# Patient Record
Sex: Male | Born: 1965 | Race: White | Hispanic: No | Marital: Married | Smoking: Never smoker
Health system: Southern US, Community
[De-identification: ages and names within clinical notes are randomized; demographics above are authoritative.]

## PROBLEM LIST (undated history)

## (undated) DIAGNOSIS — I10 Essential (primary) hypertension: Secondary | ICD-10-CM

## (undated) DIAGNOSIS — G4730 Sleep apnea, unspecified: Secondary | ICD-10-CM

## (undated) DIAGNOSIS — Z96619 Presence of unspecified artificial shoulder joint: Secondary | ICD-10-CM

## (undated) DIAGNOSIS — Z955 Presence of coronary angioplasty implant and graft: Secondary | ICD-10-CM

## (undated) DIAGNOSIS — D751 Secondary polycythemia: Secondary | ICD-10-CM

## (undated) DIAGNOSIS — E785 Hyperlipidemia, unspecified: Secondary | ICD-10-CM

## (undated) DIAGNOSIS — I509 Heart failure, unspecified: Secondary | ICD-10-CM

## (undated) DIAGNOSIS — I2510 Atherosclerotic heart disease of native coronary artery without angina pectoris: Secondary | ICD-10-CM

## (undated) HISTORY — PX: APPENDECTOMY: SHX54

## (undated) HISTORY — PX: TOTAL SHOULDER REPLACEMENT: SUR1217

## (undated) HISTORY — PX: OTHER SURGICAL HISTORY: SHX169

## (undated) HISTORY — PX: HERNIA REPAIR: SHX51

---

## 2000-07-26 ENCOUNTER — Ambulatory Visit (HOSPITAL_COMMUNITY): Admission: RE | Admit: 2000-07-26 | Discharge: 2000-07-26 | Payer: Self-pay | Admitting: Surgery

## 2005-08-10 ENCOUNTER — Encounter: Payer: Self-pay | Admitting: Urology

## 2005-08-29 ENCOUNTER — Encounter: Payer: Self-pay | Admitting: Urology

## 2006-01-18 ENCOUNTER — Encounter: Admission: RE | Admit: 2006-01-18 | Discharge: 2006-01-18 | Payer: Self-pay | Admitting: Specialist

## 2006-07-24 ENCOUNTER — Encounter: Admission: RE | Admit: 2006-07-24 | Discharge: 2006-07-24 | Payer: Self-pay | Admitting: Specialist

## 2006-07-27 ENCOUNTER — Encounter: Admission: RE | Admit: 2006-07-27 | Discharge: 2006-07-27 | Payer: Self-pay | Admitting: Orthopedic Surgery

## 2006-08-05 ENCOUNTER — Ambulatory Visit (HOSPITAL_BASED_OUTPATIENT_CLINIC_OR_DEPARTMENT_OTHER): Admission: RE | Admit: 2006-08-05 | Discharge: 2006-08-05 | Payer: Self-pay | Admitting: Orthopedic Surgery

## 2008-02-27 ENCOUNTER — Encounter: Admission: RE | Admit: 2008-02-27 | Discharge: 2008-02-27 | Payer: Self-pay | Admitting: Specialist

## 2008-03-06 ENCOUNTER — Encounter: Admission: RE | Admit: 2008-03-06 | Discharge: 2008-03-06 | Payer: Self-pay | Admitting: Specialist

## 2008-10-08 ENCOUNTER — Encounter: Admission: RE | Admit: 2008-10-08 | Discharge: 2008-10-08 | Payer: Self-pay | Admitting: Specialist

## 2009-04-15 ENCOUNTER — Encounter: Admission: RE | Admit: 2009-04-15 | Discharge: 2009-04-15 | Payer: Self-pay | Admitting: Specialist

## 2009-10-21 ENCOUNTER — Encounter: Admission: RE | Admit: 2009-10-21 | Discharge: 2009-10-21 | Payer: Self-pay | Admitting: Specialist
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
## 2010-06-16 NOTE — Op Note (Signed)
[facility]  Patient:    Paul Bennett, Paul Bennett                    MRN: [id number] Proc. Date: 07/26/00 Adm. Date:  [id number] Attending:  Bonnetta Barry                           Operative Report  PREOPERATIVE DIAGNOSIS:  Right inguinal hernia, reducible.  POSTOPERATIVE DIAGNOSIS:  Right inguinal hernia, reducible.  PROCEDURE:  Repair of right inguinal hernia with Prolene mesh.  SURGEON:  Velora Heckler, M.D.  ANESTHESIA:  General per Dr. Rica Mast.  ESTIMATED BLOOD LOSS:  Minimal.  PREPARATION:  Betadine.  COMPLICATIONS:  None.  INDICATIONS:  The patient is a 45 year old white male who presents with a right inguinal hernia present since February 2002.  The patient has noted some discomfort associated with bowel movements.  It has caused him some restriction in his weight lifting activities.  He now comes to surgery for repair.  DESCRIPTION OF PROCEDURE:  The procedure was done in OR #1 at the [facility].  The patient was brought to the operating room and placed in a supine position on the operating room table.  Following the administration of general anesthesia, the patient is prepped and draped in the usual strict aseptic fashion.  After ascertaining an adequate level of anesthesia had been obtained, a right inguinal incision is made with a #10 blade.  Dissection is carried down through the subcutaneous tissues. Hemostasis was obtained with the electrocautery.  External oblique fascia is incised in line with its fibers and extended through the external inguinal ring, taking care to preserve the ilioinguinal nerve.  Nerve and cord structures are encircled and a Penrose drain.  Good hemostasis is noted.  The floor of the inguinal canal is defined.  The floor is somewhat attenuated but largely intact.  The cord is then explored, and there is an indirect inguinal hernia sac present.  This is dissected away from the cord  structures up to the level of the internal inguinal ring.  A high ligation of the cord is performed with a 2-0 silk suture ligature.  The sac is excised and discarded.  Next, the floor of the inguinal canal is reinforced with a sheet of Prolene mesh.  The mesh is cut to the appropriate dimensions.  It is secured to the pubic tubercle and along the inguinal ligament with a running 2-0 Novofil suture. Mesh is split to accommodate the cord structures.  Superior margin of the mesh is secured to the transversalis and external oblique fascia with interrupted 2-0 Novofil sutures.  Tails of the mesh are overlapped, and a single interrupted 2-0 Novofil suture is placed lateral to the cord structures to secure the inferior edges of the tails of the mesh to the ilioinguinal ligament.  Good hemostasis is noted.  The cord structures are returned to the inguinal canal.  Local field block is placed with Marcaine.  The external oblique fascia is closed with interrupted 3-0 Vicryl sutures.  Scarpas fascia is closed with interrupted 3-0 Vicryl sutures.  The skin edges are anesthetized with local anesthetic.  The skin edges are closed with interrupted 4-0 Vicryl subcuticular sutures.  The wound is washed and dried, and Benzoin and Steri-Strips are applied.  The right testicle is delivered back into the right hemiscrotum.  Occlusive dressing is applied.  The patient is awakened from anesthesia and brought to  the recovery room in stable condition.  The patient tolerated the procedure well. DD:  07/26/00 TD:  07/26/00 Job: [id number]

## 2010-06-30 ENCOUNTER — Ambulatory Visit: Payer: Self-pay | Admitting: Internal Medicine

## 2010-07-28 ENCOUNTER — Ambulatory Visit: Payer: Self-pay | Admitting: Internal Medicine

## 2010-07-30 ENCOUNTER — Ambulatory Visit: Payer: Self-pay | Admitting: Internal Medicine

## 2010-08-30 ENCOUNTER — Ambulatory Visit: Payer: Self-pay

## 2010-08-30 ENCOUNTER — Ambulatory Visit: Payer: Self-pay | Admitting: Internal Medicine

## 2010-11-14 LAB — POCT HEMOGLOBIN-HEMACUE
Hemoglobin: 18.2 — ABNORMAL HIGH
Operator id: 268271

## 2012-01-08 ENCOUNTER — Inpatient Hospital Stay: Payer: Self-pay | Admitting: Internal Medicine

## 2012-01-08 LAB — COMPREHENSIVE METABOLIC PANEL
Anion Gap: 6 — ABNORMAL LOW (ref 7–16)
Calcium, Total: 8.7 mg/dL (ref 8.5–10.1)
Chloride: 102 mmol/L (ref 98–107)
Co2: 30 mmol/L (ref 21–32)
EGFR (African American): >60
EGFR (Non-African Amer.): >60
Glucose: 87 mg/dL (ref 65–99)
Osmolality: 277 (ref 275–301)
Potassium: 3.3 mmol/L — ABNORMAL LOW (ref 3.5–5.1)
SGOT(AST): 32 U/L (ref 15–37)
Sodium: 138 mmol/L (ref 136–145)

## 2012-01-08 LAB — CBC
HGB: 16.9 g/dL (ref 13.0–18.0)
MCHC: 33.6 g/dL (ref 32.0–36.0)
RDW: 14.7 % — ABNORMAL HIGH (ref 11.5–14.5)

## 2012-01-08 LAB — APTT: Activated PTT: 30 secs (ref 23.6–35.9)

## 2012-01-08 LAB — TROPONIN I
Troponin-I: 0.41 ng/mL — ABNORMAL HIGH
Troponin-I: 0.97 ng/mL — ABNORMAL HIGH

## 2012-01-08 LAB — CK TOTAL AND CKMB (NOT AT ARMC): CK-MB: 2.9 ng/mL (ref 0.5–3.6)

## 2012-01-09 LAB — COMPREHENSIVE METABOLIC PANEL
Albumin: 2.8 g/dL — ABNORMAL LOW (ref 3.4–5.0)
Anion Gap: 6 — ABNORMAL LOW (ref 7–16)
Bilirubin,Total: 1.1 mg/dL — ABNORMAL HIGH (ref 0.2–1.0)
Calcium, Total: 8.4 mg/dL — ABNORMAL LOW (ref 8.5–10.1)
Co2: 29 mmol/L (ref 21–32)
Creatinine: 1.32 mg/dL — ABNORMAL HIGH (ref 0.60–1.30)
EGFR (Non-African Amer.): >60
Osmolality: 281 (ref 275–301)
Potassium: 4.3 mmol/L (ref 3.5–5.1)
Sodium: 140 mmol/L (ref 136–145)

## 2012-01-09 LAB — DRUG SCREEN, URINE
Amphetamines, Ur Screen: NEGATIVE (ref ?–1000)
Barbiturates, Ur Screen: NEGATIVE (ref ?–200)
Benzodiazepine, Ur Scrn: POSITIVE (ref ?–200)
Cannabinoid 50 Ng, Ur ~~LOC~~: NEGATIVE (ref ?–50)
Cocaine Metabolite,Ur ~~LOC~~: NEGATIVE (ref ?–300)
Methadone, Ur Screen: NEGATIVE (ref ?–300)
Opiate, Ur Screen: NEGATIVE (ref ?–300)
Phencyclidine (PCP) Ur S: NEGATIVE (ref ?–25)
Tricyclic, Ur Screen: NEGATIVE (ref ?–1000)

## 2012-01-09 LAB — CBC WITH DIFFERENTIAL/PLATELET
Basophil #: 0 10*3/uL (ref 0.0–0.1)
Basophil %: 0.5 %
HGB: 15.9 g/dL (ref 13.0–18.0)
Lymphocyte %: 28.3 %
Monocyte %: 10.4 %
Neutrophil #: 4.7 10*3/uL (ref 1.4–6.5)
Platelet: 211 10*3/uL (ref 150–440)
RBC: 5.19 10*6/uL (ref 4.40–5.90)
WBC: 8.1 10*3/uL (ref 3.8–10.6)

## 2012-01-09 LAB — LIPID PANEL
Cholesterol: 115 mg/dL (ref 0–200)
HDL Cholesterol: 25 mg/dL — ABNORMAL LOW (ref 40–60)
Ldl Cholesterol, Calc: 73 mg/dL (ref 0–100)
Triglycerides: 86 mg/dL (ref 0–200)
VLDL Cholesterol, Calc: 17 mg/dL (ref 5–40)

## 2012-01-31 LAB — CBC
HCT: 52.1 % — ABNORMAL HIGH (ref 40.0–52.0)
HGB: 17.7 g/dL (ref 13.0–18.0)
MCH: 30.5 pg (ref 26.0–34.0)
MCHC: 34 g/dL (ref 32.0–36.0)
Platelet: 167 10*3/uL (ref 150–440)
RBC: 5.81 10*6/uL (ref 4.40–5.90)
RDW: 14.9 % — ABNORMAL HIGH (ref 11.5–14.5)
WBC: 7.1 10*3/uL (ref 3.8–10.6)

## 2012-01-31 LAB — TROPONIN I
Troponin-I: <0.02 ng/mL
Troponin-I: 0.31 ng/mL — ABNORMAL HIGH

## 2012-01-31 LAB — COMPREHENSIVE METABOLIC PANEL
Alkaline Phosphatase: 64 U/L (ref 50–136)
BUN: 20 mg/dL — ABNORMAL HIGH (ref 7–18)
Bilirubin,Total: 0.9 mg/dL (ref 0.2–1.0)
Chloride: 106 mmol/L (ref 98–107)
Glucose: 85 mg/dL (ref 65–99)
SGPT (ALT): 49 U/L (ref 12–78)
Total Protein: 7 g/dL (ref 6.4–8.2)

## 2012-02-01 ENCOUNTER — Inpatient Hospital Stay: Payer: Self-pay | Admitting: Internal Medicine

## 2012-02-01 DIAGNOSIS — I2510 Atherosclerotic heart disease of native coronary artery without angina pectoris: Secondary | ICD-10-CM

## 2012-02-01 LAB — CK TOTAL AND CKMB (NOT AT ARMC)
CK, Total: 294 U/L — ABNORMAL HIGH (ref 35–232)
CK, Total: 406 U/L — ABNORMAL HIGH (ref 35–232)

## 2012-02-01 LAB — CBC WITH DIFFERENTIAL/PLATELET
Basophil #: 0.1 10*3/uL (ref 0.0–0.1)
Eosinophil #: 0.1 10*3/uL (ref 0.0–0.7)
Eosinophil %: 0.9 %
Lymphocyte #: 1.8 10*3/uL (ref 1.0–3.6)
Lymphocyte %: 13.9 %
MCH: 31.5 pg (ref 26.0–34.0)
MCHC: 35.2 g/dL (ref 32.0–36.0)
Neutrophil #: 9.5 10*3/uL — ABNORMAL HIGH (ref 1.4–6.5)
Neutrophil %: 75.2 %
RBC: 5.45 10*6/uL (ref 4.40–5.90)
RDW: 14.7 % — ABNORMAL HIGH (ref 11.5–14.5)

## 2012-02-01 LAB — APTT: Activated PTT: 89.1 secs — ABNORMAL HIGH (ref 23.6–35.9)

## 2012-02-02 LAB — BASIC METABOLIC PANEL
Anion Gap: 6 — ABNORMAL LOW (ref 7–16)
BUN: 10 mg/dL (ref 7–18)
Calcium, Total: 8.2 mg/dL — ABNORMAL LOW (ref 8.5–10.1)
Chloride: 108 mmol/L — ABNORMAL HIGH (ref 98–107)
Co2: 28 mmol/L (ref 21–32)
Creatinine: 1.26 mg/dL (ref 0.60–1.30)
EGFR (African American): >60
EGFR (Non-African Amer.): >60
Glucose: 75 mg/dL (ref 65–99)
Osmolality: 281 (ref 275–301)
Potassium: 3.8 mmol/L (ref 3.5–5.1)
Sodium: 142 mmol/L (ref 136–145)

## 2012-02-02 LAB — APTT: Activated PTT: 30.4 secs (ref 23.6–35.9)

## 2012-02-02 LAB — CK TOTAL AND CKMB (NOT AT ARMC)
CK, Total: 536 U/L — ABNORMAL HIGH (ref 35–232)
CK-MB: 41.5 ng/mL — ABNORMAL HIGH (ref 0.5–3.6)

## 2012-02-04 ENCOUNTER — Other Ambulatory Visit: Payer: Self-pay | Admitting: Cardiovascular Disease

## 2012-12-08 ENCOUNTER — Inpatient Hospital Stay: Payer: Self-pay | Admitting: Cardiovascular Disease

## 2012-12-08 LAB — TROPONIN I
Troponin-I: 0.06 ng/mL — ABNORMAL HIGH
Troponin-I: 0.07 ng/mL — ABNORMAL HIGH

## 2012-12-08 LAB — CBC
MCH: 31.6 pg (ref 26.0–34.0)
Platelet: 157 10*3/uL (ref 150–440)
RBC: 5.06 10*6/uL (ref 4.40–5.90)

## 2012-12-08 LAB — BASIC METABOLIC PANEL
BUN: 18 mg/dL (ref 7–18)
Calcium, Total: 8.9 mg/dL (ref 8.5–10.1)
Co2: 28 mmol/L (ref 21–32)
Creatinine: 1.25 mg/dL (ref 0.60–1.30)
EGFR (African American): >60
Potassium: 3.6 mmol/L (ref 3.5–5.1)

## 2012-12-08 LAB — CK TOTAL AND CKMB (NOT AT ARMC): CK, Total: 124 U/L (ref 35–232)

## 2012-12-09 LAB — CBC WITH DIFFERENTIAL/PLATELET
Basophil #: 0.1 10*3/uL (ref 0.0–0.1)
Basophil %: 1 %
HCT: 42.8 % (ref 40.0–52.0)
MCHC: 34.7 g/dL (ref 32.0–36.0)
RBC: 4.7 10*6/uL (ref 4.40–5.90)
RDW: 13.5 % (ref 11.5–14.5)

## 2012-12-09 LAB — APTT: Activated PTT: 79 secs — ABNORMAL HIGH (ref 23.6–35.9)

## 2012-12-09 LAB — LIPID PANEL
HDL Cholesterol: 47 mg/dL (ref 40–60)
Triglycerides: 107 mg/dL (ref 0–200)

## 2012-12-09 LAB — BASIC METABOLIC PANEL
Anion Gap: 2 — ABNORMAL LOW (ref 7–16)
EGFR (African American): >60
EGFR (Non-African Amer.): >60
Glucose: 114 mg/dL — ABNORMAL HIGH (ref 65–99)
Osmolality: 284 (ref 275–301)
Sodium: 142 mmol/L (ref 136–145)

## 2012-12-09 LAB — MAGNESIUM: Magnesium: 1.6 mg/dL — ABNORMAL LOW

## 2012-12-10 LAB — BASIC METABOLIC PANEL
Anion Gap: 4 — ABNORMAL LOW (ref 7–16)
Calcium, Total: 8.3 mg/dL — ABNORMAL LOW (ref 8.5–10.1)
Co2: 28 mmol/L (ref 21–32)
Glucose: 90 mg/dL (ref 65–99)
Osmolality: 277 (ref 275–301)
Sodium: 139 mmol/L (ref 136–145)

## 2012-12-10 LAB — CBC WITH DIFFERENTIAL/PLATELET
Basophil #: 0.1 10*3/uL (ref 0.0–0.1)
Basophil %: 0.8 %
Eosinophil %: 3.3 %
HCT: 42.8 % (ref 40.0–52.0)
HGB: 14.9 g/dL (ref 13.0–18.0)
Monocyte %: 8.1 %
Neutrophil #: 4.6 10*3/uL (ref 1.4–6.5)
Neutrophil %: 65.7 %
WBC: 6.9 10*3/uL (ref 3.8–10.6)

## 2012-12-10 LAB — CK TOTAL AND CKMB (NOT AT ARMC): CK-MB: 2.3 ng/mL (ref 0.5–3.6)

## 2013-02-10 ENCOUNTER — Emergency Department: Payer: Self-pay | Admitting: Emergency Medicine

## 2013-02-10 LAB — BASIC METABOLIC PANEL
Anion Gap: 4 — ABNORMAL LOW (ref 7–16)
BUN: 18 mg/dL (ref 7–18)
Calcium, Total: 8.6 mg/dL (ref 8.5–10.1)
Chloride: 104 mmol/L (ref 98–107)
Co2: 29 mmol/L (ref 21–32)
Creatinine: 0.94 mg/dL (ref 0.60–1.30)
EGFR (Non-African Amer.): >60
Glucose: 83 mg/dL (ref 65–99)
Osmolality: 275 (ref 275–301)
Potassium: 3.6 mmol/L (ref 3.5–5.1)
SODIUM: 137 mmol/L (ref 136–145)

## 2013-02-10 LAB — CBC WITH DIFFERENTIAL/PLATELET
BASOS ABS: 0.1 10*3/uL (ref 0.0–0.1)
Basophil %: 1.1 %
EOS ABS: 0.1 10*3/uL (ref 0.0–0.7)
Eosinophil %: 1.5 %
HCT: 45.2 % (ref 40.0–52.0)
HGB: 15.4 g/dL (ref 13.0–18.0)
Lymphocyte #: 0.9 10*3/uL — ABNORMAL LOW (ref 1.0–3.6)
Lymphocyte %: 14.7 %
MCH: 30.7 pg (ref 26.0–34.0)
MCHC: 33.9 g/dL (ref 32.0–36.0)
MCV: 90 fL (ref 80–100)
MONO ABS: 0.6 x10 3/mm (ref 0.2–1.0)
MONOS PCT: 10.5 %
NEUTROS ABS: 4.3 10*3/uL (ref 1.4–6.5)
Neutrophil %: 72.2 %
PLATELETS: 133 10*3/uL — AB (ref 150–440)
RBC: 5 10*6/uL (ref 4.40–5.90)
RDW: 13.6 % (ref 11.5–14.5)
WBC: 5.9 10*3/uL (ref 3.8–10.6)

## 2013-02-10 LAB — TROPONIN I
Troponin-I: <0.02 ng/mL
Troponin-I: <0.02 ng/mL

## 2013-02-10 LAB — PROTIME-INR
INR: 1.1
Prothrombin Time: 13.7 secs (ref 11.5–14.7)

## 2013-02-10 LAB — APTT: Activated PTT: 29.7 secs (ref 23.6–35.9)

## 2013-02-10 LAB — CK TOTAL AND CKMB (NOT AT ARMC)
CK, TOTAL: 245 U/L — AB (ref 35–232)
CK-MB: 2.6 ng/mL (ref 0.5–3.6)

## 2014-05-18 NOTE — H&P (Signed)
PATIENT NAME:  Paul, Bennett MR#:  [id number] DATE OF BIRTH:  1965-06-15  DATE OF ADMISSION:  01/08/2012  REFERRING PHYSICIAN: Dr. Charlesetta Ivory    PRIMARY CARE PHYSICIAN: Dr. Elijio Miles   PRIMARY CARDIOLOGIST: Dr. Neoma Laming    CHIEF COMPLAINT: Chest pain.   HISTORY OF PRESENT ILLNESS: This is a 49 year old male with significant past medical history of hyperlipidemia, hypertension, polycythemia, and low testosterone syndrome who presents with complaint of chest pain. The patient reports initial episode he had was yesterday when he was working out. It was retrosternal and dull, resolved spontaneously. He reports today he had another episode while having intercourse with his wife. He reports chest pain resolved spontaneously without any intervention. He denies any palpitations, lightheadedness, diaphoresis, shortness of breath, or nausea. In the ED, the patient had EKG done which did not show any significant ST or T wave abnormalities but he had positive troponin at 0.41. The patient was given 324 mg of aspirin in the ED. Discussed with Cardiology on-call, Dr. Lovena Le, who recommended to start the patient on heparin drip and beta-blockers as well. The patient denies any such previous episodes of chest pain. Denies any current chest pain at this point. Hospitalist service was requested to admit the patient for further management of his acute coronary syndrome.    PAST MEDICAL HISTORY:  1. Hypertension.  2. Hyperlipidemia.  3. Low testosterone syndrome.  4. Polycythemia.   PAST SURGICAL HISTORY:  1. Right biceps, left biceps, and knee surgery.  2. Right shoulder reduction.   ALLERGIES: No known drug allergies.  HOME MEDICATIONS:  1. Hydrochlorothiazide/losartan 1 tablet oral daily.  2. Crestor, unknown dose, oral daily.  3. Aspirin 325 mg oral daily.  4. The patient reports taking testosterone shots 250 mg every week   FAMILY HISTORY: Denies any family history of cardiac disease at  young age. Reports an uncle had melanoma.   SOCIAL HISTORY: The patient denies any tobacco abuse. Drinks alcohol occasionally over the weekend. No illicit drug use. Works at [employer]: Denies any fever, fatigue, weakness. EYES: Denies blurry vision, double vision, or pain. ENT: Denies tinnitus, ear pain, epistaxis, nasal discharge, or hearing loss. RESPIRATORY: Denies cough, wheezing, hemoptysis. CARDIOVASCULAR: Has complaints of chest pain, currently resolved. No edema. No palpitations. No syncope. GI: Denies nausea, vomiting, diarrhea, abdominal pain, hematemesis. GU: Denies dysuria, hematuria, renal colic. ENDOCRINE: Denies polyuria, polydipsia, heat or cold intolerance. HEMATOLOGY: Denies any anemia, easy bruising, bleeding diathesis. Has history of polycythemia. giving regular blood donations every eight weeks. INTEGUMENTARY: Denies acne, rash, or skin lesions. MUSCULOSKELETAL: Denies neck pain, knee pain, arthritis, cramps, gout. NEUROLOGIC: Denies numbness, dysarthria, epilepsy, tremors, vertigo, ataxia. PSYCHIATRIC: Denies anxiety, insomnia, bipolar, depression, or schizophrenia.   PHYSICAL EXAMINATION:   VITAL SIGNS: Temperature 98.4, pulse 75, respiratory rate 20, blood pressure 137/73, saturating 95% on room air.   GENERAL: Healthy well nourished muscular male who looks comfortable in bed in no apparent distress.   HEENT: Head atraumatic, normocephalic. Pupils equal, reactive to light. Pink conjunctivae. Anicteric sclerae. Moist oral mucosa.   NECK: Supple. No thyromegaly. No JVD.   CHEST: Good air entry bilaterally. No wheezing, rales, rhonchi. No reproducible chest pain on palpation.   CARDIOVASCULAR: S1, S2 heard. No rubs, murmur, or gallops.   ABDOMEN: Soft, nontender, nondistended. Bowel sounds present.   EXTREMITIES: No edema. No clubbing. No cyanosis.   PSYCHIATRIC: Appropriate affect. Awake, alert x3. Intact judgment and insight.   SKIN: Normal skin  turgor.  Warm and dry.   PSYCHIATRIC: Appropriate affect. Awake, alert x3. Intact judgment and insight.   NEUROLOGIC: Cranial nerves grossly intact. Motor 5 out of 5. No focal deficits.   PERTINENT LABS: Glucose 87, BUN 18, creatinine 1.36, sodium 138, potassium 3.3, chloride 102, CO2 30. Troponin 0.41. Total CK 374. CK-MB 2.7. White blood cells 13.5, hemoglobin 16.9, hematocrit 50.3, platelet 229. PTT 30.   EKG showing normal sinus rhythm with ventricular rate of 85 with no ST or T wave changes.   ASSESSMENT AND PLAN: This is a 49 year old male who presents with complaints of chest pain. Has positive troponins.  1. Acute coronary syndrome. The patient has elevated troponin. Will continue to cycle his troponins. Was given 324 of aspirin in the ED. Currently he is chest pain free. He is already on statin at home and ACE inhibitor. ED physician discussed with Dr. Lovena Le, cardiologist on call. The patient will be started on beta-blockers as well as IV heparin drip. Most likely will need cardiac cath. Will put in for consult with Dr. Neoma Laming, who is the patient's primary cardiologist. The patient will be admitted to telemetry floor.  2. Hypokalemia. Will replace. Recheck in a.m.  3. Hypertension. Will continue home medication.  4. Hyperlipidemia. Will continue with Crestor. Will check lipid panel.  5. History of polycythemia. Hemoglobin is acceptable now. Will continue to monitor.  6. Low testosterone syndrome. Will hold the patient's testosterone.  7. DVT prophylaxis. The patient is on full dose anticoagulation.   CODE STATUS: FULL CODE.   TOTAL TIME SPENT ON ADMISSION AND PATIENT CARE: 55 minutes.   ____________________________ Albertine Patricia, MD dse:drc D: 01/08/2012 03:28:11 ET T: 01/08/2012 08:21:03 ET JOB#: [id number]  cc: Albertine Patricia, MD, <Dictator> Venetia Maxon. Elijio Miles, MD Maree Ainley Graciela Husbands MD ELECTRONICALLY SIGNED 01/11/2012 2:43

## 2014-05-18 NOTE — Consult Note (Signed)
Brief Consult Note: Diagnosis: Exertional CP with elevated TNI.   Consult note dictated.   Recommend to proceed with surgery or procedure.   Recommend further assessment or treatment.   Comments: Patient with CP that began last Saturday after working out across upper chest that recurred last evening with associated L sided CP radiating into jaw and L arm. No acute EKG abnormalities, but TNI 0.41. Patient CP free at this time and on BB, heparin gtt, statin and cardiac cath scheduled for later today.  Electronic Signatures: Angelica Ran (MD)   (Signed 10-Dec-13 10:59)  Co-Signer: Brief Consult Note Yazmyne Sara A (PA-C)   (Signed 10-Dec-13 08:31)  Authored: Brief Consult Note  Last Updated: 10-Dec-13 10:59 by Angelica Ran (MD)

## 2014-05-18 NOTE — Consult Note (Signed)
PATIENT NAME:  Paul Bennett, Paul Bennett MR#:  [id number] DATE OF BIRTH:  1965/05/21  DATE OF CONSULTATION:  01/08/2012  REFERRING PHYSICIAN:  Dr. Waldron Labs  CONSULTING PHYSICIAN:  Merla Riches, PA-C  PRIMARY CARE PHYSICIAN: Dr. Elijio Miles   REASON FOR CONSULTATION: Chest pain.   HISTORY OF PRESENT ILLNESS: Mr. Paul Bennett is a 49 year old white male with a past medical history significant for hyperlipidemia, hypertension, polycythemia, and low testosterone syndrome. He noticed that when he was exercising at the gym this last Saturday at the end of the workout he had pain across his upper chest. It was a dull type of pain that resolved. He also notes that last evening while having sexual intercourse with his wife he developed pain in the center in the left chest that radiated to his jaw and down the left arm and hand. He had associated left arm numbness and tingling. There was no associated shortness of breath, coughing, or wheezing. He is chest pain free at this time and denies any orthopnea and pedal edema. The Emergency Department EKG did not show any acute ST or T wave abnormalities but he did have a positive troponin of 0.41. He was admitted for further evaluation.   PAST MEDICAL HISTORY:  1. Hypertension.  2. Hyperlipidemia.  3. Low testosterone syndrome.  4. Polycythemia.  5. Sleep apnea.   PAST SURGICAL HISTORY:  1. Biceps and triceps repair.  2. Left knee microfracture, status post surgery.  3. Gynecomastia surgery to bilateral chest in 1996.  4. Appendectomy 1996.  5. Hernia repair 2002 (right inguinal hernia repair).   ALLERGIES: No known drug allergies.    HOME MEDICATIONS:  1. Diovan/HCT 160/12.5 one tablet p.o. daily.  2. Crestor 5 mg 1 tablet p.o. at bedtime.  3. Aspirin 325 mg daily.  4. Niaspan 500 mg 1 tablet p.o. at bedtime  5. Testosterone shot 250 mg every week.   FAMILY HISTORY: Maternal uncle with melanoma, heart disease, hypertension.   SOCIAL HISTORY: The  patient works at [employer]. He is married. He denies any tobacco abuse. He occasionally drinks alcohol on the weekends. Does not use any illicit drugs. Exercises at least 3 times per week.   REVIEW OF SYSTEMS: GENERAL: The patient denies any fever, fatigue, or weakness. EYES: Denies blurry vision, double vision, or pain. ENT: Denies tinnitus, ear pain, epistaxis, nasal discharge, hearing loss. RESPIRATORY: Denies coughing, wheezing, shortness of breath, orthopnea, hemoptysis. CARDIOVASCULAR: The patient has chest pain. See the history of present illness. GU: Denies dysuria, hematuria. HEMATOLOGY: Denies easy bleeding, although has a history of polycythemia, getting therapeutic phlebotomies every eight weeks. MUSCULOSKELETAL: Denies neck pain, knee pain, arthritis.   PHYSICAL EXAMINATION:   GENERAL: This is a pleasant male who is not in any acute distress. He is alert and oriented x3.   VITAL SIGNS: Temperature 94 degrees Fahrenheit, heart rate 80, respiratory rate 18, blood pressure 143/84, oxygen saturation 95% on room air.   HEENT: Head atraumatic, normocephalic. Pupils are round and equal. Conjunctivae are pink. There is no scleral icterus. Ears and nose are normal to external inspection.   MOUTH: Good dentition. Moist mucous membranes.   NECK: Supple. Trachea is midline. No carotid bruits appreciated.   LUNGS: Clear to auscultation bilaterally.   CARDIOVASCULAR: Regular rate and rhythm. No murmur murmurs, rubs, or gallops noted. No chest pain on palpitation of the chest wall.   ABDOMEN: Nondistended. Bowel sounds present in all four quadrants. Soft and nontender to palpation with no rebound tenderness, guarding, or  peritoneal signs.   EXTREMITIES: No cyanosis, clubbing, or edema.   ANCILLARY DATA: Chest x-ray no acute changes.   EKG normal sinus rhythm, heart rate 85, no acute ST-T wave changes.   LABORATORY DATA: Glucose 87, BUN 18, creatinine 1.36, sodium 138, potassium 3.3, chloride  102, CO2 30, estimated GFR is greater than 60, total protein 7.2, albumin 3.4, total bilirubin 0.9, alkaline phosphatase 76, AST 32, ALT 38. Total CK 281. CK-MB 4.0. Troponin-I 0.41. White blood cell count 13.5, hemoglobin 16.9, hematocrit 50.3, platelet count 229,000. PTT 30.   ASSESSMENT/PLAN:  1. Chest pain with elevated troponins, possible non-ST elevation MI. The patient is currently chest pain free at this time but had exertional chest pain radiating to the arm and jaw. He was given aspirin, beta-blocker, statin, ACE, and has been started on a heparin drip. Cardiac cath will be done later today with Dr. Neoma Laming. The benefits and risks of the procedure were reviewed in detail today and he is in agreement.  2. Hypertension. The patient's blood pressure is controlled at this time on his current medication.  3. Hyperlipidemia. The patient is on Crestor 5 mg. Depending on cath results, this dose may need to be increased if he has significant coronary disease.   Thank you very much for allowing me to participate in this patient's care.   ____________________________ Merla Riches, PA-C mam:drc D: 01/08/2012 08:49:50 ET T: 01/08/2012 11:25:46 ET JOB#: [id number]  cc: Merla Riches, PA-C, <Dictator> Nicko Daher A Reinhard Schack PA ELECTRONICALLY SIGNED 01/09/2012 19:21

## 2014-05-18 NOTE — Discharge Summary (Signed)
PATIENT NAME:  Paul Bennett, Paul Bennett MR#:  [id number] DATE OF BIRTH:  1965/04/22  DATE OF ADMISSION:  01/08/2012 DATE OF DISCHARGE:  01/09/2012  DISCHARGE DIAGNOSIS: Non-ST elevation myocardial infarction.   PROCEDURE: Left heart catheterization 01/08/2012.   CONSULTATIONS: Cardiology, Dr. Neoma Laming.   DISCHARGE MEDICATIONS:  1. Plavix 75 mg daily.  2. Isosorbide mononitrate 30 mg daily.  3. Metoprolol tartrate 25 mg b.i.d.  4. Crestor 40 mg daily.  5. Aspirin chewable 81 mg daily.   HOSPITAL COURSE: The patient was admitted through the Emergency Room after presenting with acute substernal chest pain. Please refer to the History and Physical for details. Non-ST elevation myocardial infarction was confirmed on positive troponins. The patient was initially treated with aspirin, topical nitrates, beta blockers, and Plavix. His was seen by cardiology who performed a left heart catheterization which did not show obstructive coronary artery disease. He was on placed on Integrilin for 24 hours. He remained pain free for the duration of his hospital stay, which was uncomplicated. The patient is being discharged to home in satisfactory condition.   DIET: Low fat, low cholesterol.   ACTIVITY: No undue exertional activity.   FOLLOWUP: Follow up with Dr. Neoma Laming in three days.       DISCHARGE PROCESS TIME SPENT: 36 minutes.    ____________________________ Venetia Maxon Elijio Miles, MD sat:bjt D: 01/09/2012 13:29:28 ET T: 01/09/2012 13:39:04 ET JOB#: [id number]  cc: Alfredia Ferguson A. Elijio Miles, MD, <Dictator> Veverly Fells MD ELECTRONICALLY SIGNED 01/23/2012 12:24

## 2014-05-21 NOTE — Consult Note (Signed)
General Aspect 49 yo male with history of nstemi in 12/13 with cardiac cath on 01/08/12 revealing 20-30% lm, 70% proximal d1, 70% mid d1, diffuse distal lad disease; 40-50% circumflex disease. He was treated medically and discharged on asa, plavix, nitrates and beta blockers. He was doing fairly well limiting his acitivity until last pm when he exercized more vigourously and developed worsening chest pain and shortness of breath. He presented to the er where his ekg revealed nonspecific st t wave changes. Initial troponin was 0.31; Subsequent troponin was 0.79 and 2.8. CK and mb were also elevated consistent with a nstemi. Pt continued to have intermitant chest pain since admission. He is currently hemodynamically stable.   Physical Exam:   GEN well developed, well nourished, no acute distress    HEENT PERRL, hearing intact to voice    NECK supple    RESP normal resp effort  clear BS    CARD Regular rate and rhythm  Normal, S1, S2  No murmur    ABD denies tenderness  no liver/spleen enlargement  no hernia  normal BS  no Abdominal Bruits    LYMPH negative neck    EXTR negative cyanosis/clubbing, negative edema    SKIN normal to palpation    NEURO cranial nerves intact, motor/sensory function intact    PSYCH A+O to time, place, person   Review of Systems:   Subjective/Chief Complaint chest pain    General: No Complaints    Skin: No Complaints    ENT: No Complaints    Eyes: No Complaints    Neck: No Complaints    Respiratory: No Complaints    Cardiovascular: Chest pain or discomfort  Tightness  Dyspnea    Gastrointestinal: No Complaints    Genitourinary: No Complaints    Vascular: No Complaints    Musculoskeletal: No Complaints    Neurologic: No Complaints    Hematologic: No Complaints    Endocrine: No Complaints    Psychiatric: No Complaints    Review of Systems: All other systems were reviewed and found to be negative    Medications/Allergies Reviewed  Medications/Allergies reviewed     Cholesterol:    HTN:   Home Medications: Medication Instructions Status  hydrochlorothiazide-losartan 12.5 mg-100 mg oral tablet 1 tab(s) orally once a day Active  clopidogrel 75 mg oral tablet 1 tab(s) orally once a day Active  Aspirin Low Dose 81 mg oral tablet 1 tab(s) orally once a day Active  isosorbide mononitrate 30 mg oral tablet, extended release 1 tab(s) orally once a day (at bedtime) Active  Crestor 40 mg oral tablet 1 tab(s) orally once a day (at bedtime) Active  metoprolol succinate 25 mg oral tablet, extended release 1 tab(s) orally once a day Active   EKG:   EKG Interp. by me  NSR    Abnormal NSSTTW changes  T inversion    No Known Allergies:     Impression 49 yo male with history of recent nstemi treated medically now returning with chest pain and ruled in for a nstemi. Pt has had intermitant chest pain since admission at rest. He has failued medical therapy. 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 Will discuss with interventional cardiologist and consider pci of d1.    Plan 1. Continue current meds to include asa, plavix, metoprolol, crestor and nitrates.  2. Risk and benefits of pci discussed with patient. 3. Refer to Dr. Fletcher Anon for consideration for pci of the d1 4.  Further recs pending course.   Electronic Signatures: Teodoro Spray (MD)  (Signed 03-Jan-14 13:12)  Authored: General Aspect/Present Illness, History and Physical Exam, Review of System, Past Medical History, Home Medications, EKG , Allergies, Impression/Plan   Last Updated: 03-Jan-14 13:12 by Teodoro Spray (MD)

## 2014-05-21 NOTE — H&P (Signed)
PATIENT NAME:  FARES, RAMTHUN MR#:  [id number] DATE OF BIRTH:  Dec 15, 1965  DATE OF ADMISSION:  12/08/2012  REFERRING PHYSICIAN: Dr. Thomasene Lot.   PRIMARY CARE PHYSICIAN: Dr. Maryan Puls PRIMARY CARDIOLOGIST: Dr. Humphrey Rolls at [facility].  CHIEF COMPLAINT: Chest pain.  HISTORY OF PRESENT ILLNESS: The patient is a 49 year old Caucasian male with history of CAD, status post MI back in December for which he was treated medically and then had another non-ST elevation MI in January and he received a drug-eluting stent for a first diagonal branch, which had a 99% occlusion. He also did appear to have some ostial disease of 40% to 50% and that was medically managed. The patient is an active person, who goes to gym about 5 times a week. He was at the gym this morning and had an episode of chest pain lasting about 5 to 10 minutes. EMS was called and he was given some aspirin and a sublingual spray of nitroglycerin and after that the chest pain resolved. The patient has had no further chest pains. The patient was brought in here and was noted to have a negative troponin initially, but then a second troponin about 5 hours later came back positive, slightly up. The case was discussed with Dr. Humphrey Rolls, who will see the patient as well. Of note, the patient has no pains in the chest. Hospitalist service was contacted for further evaluation and management.   PAST MEDICAL HISTORY: Hypertension, hyperlipidemia, non-ST elevation MI x 2, history of testosterone syndrome and history of to polycythemia.   PAST SURGICAL HISTORY: Multiple, status post cardiac catheter x 2, right and left biceps surgery, knee surgery, right shoulder reduction, left triceps surgery, inguinal hernia repair and appendectomy.   ALLERGIES: No known drug allergies.   OUTPATIENT MEDICATIONS: Aspirin 81 mg daily, metoprolol 25 mg extended-release 1 tab daily, losartan 50 mg 1/2 tab once a day, clopidogrel 75 mg daily, Crestor 10 mg daily and fish oil 1 cap 2  times a day.   FAMILY HISTORY: Both grandparents and also dad with MI.  REVIEW OF SYSTEMS: CONSTITUTIONAL: No fever, fatigue, weakness or weight changes.  EYES: No blurry vision or double vision. ENT: Recent sinus drainage and the patient took some over the counters and that is resolving. RESPIRATORY: No cough, wheezing, shortness of breath or painful respirations. CARDIOVASCULAR: Episode of chest pain this morning. It lasted about 5 to 10 minutes with radiation to the back, some nausea with some cold sweats. Otherwise, he is an active person and does not have any pains in the chest. GASTROINTESTINAL: No nausea, vomiting, diarrhea, abdominal pain, bloody stools or dark stools besides that episode of nausea from this morning. GENITOURINARY: Denies dysuria, hematuria. HEMATOLOGY/LYMPHATICS: No anemia or easy bruising. SKIN: No rashes.  MUSCULOSKELETAL: Denies arthritis or gout. NEUROLOGIC: Denies focal weakness or numbness. PSYCHIATRIC: No anxiety or depression.   PHYSICAL EXAMINATION:  VITAL SIGNS: Temperature on arrival 98, pulse rate 59, respiratory rate 20, initial blood pressure was 83/48. Currently the patient's blood pressure dropped after the nitroglycerin and the patient received some fluids. Last blood pressure 102/47. Oxygen saturation 98% on room air.  GENERAL: The patient is a well-developed male lying in bed in no obvious distress.  HEENT: Normocephalic, atraumatic. Pupils are equal and reactive. Anicteric sclerae. Extraocular muscles intact. Moist mucous membranes.  NECK: Supple. No thyroid tenderness. No cervical lymphadenopathy.  CARDIOVASCULAR: S1, S2, irregularly irregular. Bradycardic. No murmurs, rubs or gallops.  LUNGS: Clear to auscultation without wheezing, rhonchi or rales.  ABDOMEN:  Soft, nontender, nondistended. Positive bowel sounds in all quadrants.  EXTREMITIES: No significant lower extremity edema.  SKIN: No obvious rashes.  NEUROLOGIC: Cranial nerves II through XII  grossly intact. Strength is 5 out of 5 in all extremities. Sensation is intact to light touch.  PSYCHIATRIC: Awake, alert, oriented, pleasant, and cooperative.   LABORATORY AND IMAGING: EKG: Initial EKG showed sinus brady rate of 59. No acute ST depressions. Some slight up trend and ST segment noticed on V3, possibly in V2 and on recurrent EKG at about 1:00 p.m., that has resolved. . No significant T wave inversions. Initial troponin less than 0.02, second troponin at 0.06. Initial BUN 18, creatinine 1.25, sodium 138, potassium 3.6. WBC 5,  hemoglobin 16, platelets 157. X-ray of the chest, PA and lateral, showed no active cardiopulmonary disease.   ASSESSMENT AND PLAN: We have a 49 year old male with history of coronary artery disease, status post drug-eluting stent in January, who comes in with chest pain. The patient likely has a non-ST elevation myocardial infarction. The patient has no chest pain since the episode this morning, but the chest pain did resolve with nitroglycerin. The patient also did bump his second set of troponin. At this point, we will admit the patient for observation and follow the cyclic cardiac markers. It is reassuring that he is an active person and goes to the gym on a regular basis and has had no chest pain since his previous myocardial infarction. He has had an echocardiogram and a stress test couple of months ago at  Dr. Laurelyn Sickle office. Those will not be ordered. I have consulted  Dr. Humphrey Rolls, who will come see the patient and based on the troponins we would consider cardiac catheter or not, but in the meantime, I would continue the aspirin, statin, beta blocker and Plavix and I would also go ahead and start a heparin drip at this point, check a lipid profile and see how he does. Admit him to telemetry. He did have some hypotension with nitroglycerin. He states that his blood pressure usually is about 120s. I would go ahead and hold the losartan and start him on some gentle fluids.  Follow with cardiology's recommendations.   CODE STATUS: The patient is a full code.   Total time spent is 45 minutes.   ____________________________ Vivien Presto, MD sa:aw D: 12/08/2012 15:21:18 ET T: 12/08/2012 15:35:08 ET JOB#: [id number]  cc: Vivien Presto, MD, <Dictator> DR. MICHAEL WOLFF DR. Delleker MD ELECTRONICALLY SIGNED 12/18/2012 7:05

## 2014-05-21 NOTE — Consult Note (Signed)
PATIENT NAME:  Paul Bennett, Paul Bennett MR#:  [id number] DATE OF BIRTH:  [birth year]  DATE OF CONSULTATION:  12/08/2012  CONSULTING PHYSICIAN:  Dionisio David, MD  INDICATION FOR CONSULTATION: Non-STEMI, chest pain.   HISTORY OF PRESENT ILLNESS: This is a 49 year old white male with a past medical history of hypertension, hyperlipidemia, coronary artery disease, who was admitted to the hospital this afternoon with chest pain. I was called by Emergency Room doctor to evaluate the patient because of acute chest pain. His initial troponin was negative, but the second troponin came back positive. Recall he has a history of having non-STEMI twice last year. Since January, however, he was chest pain-free. He had a PCI and stenting of diagonal 1 branch of LAD and had a drug-eluting stent implanted. He now comes in with severe pressure-type chest pain while he was exerting in the gym. The chest pain radiated to his neck and jaw and he felt diaphoretic, dizziness and syncope. He felt like he was going to pass out. He called 911 and was brought to the Emergency Room. He was given 2 nitroglycerin with some relief of the chest pain, but his troponin came back positive; thus, I was asked to evaluate the patient and the patient was admitted by the hospitalist service on telemetry.   PAST MEDICAL HISTORY: History of PCI and stenting of the D1. He also has moderate disease in the LAD, about 50%. His D1 was 99% in January of this year and had PCI and stenting with drug-eluting stent. He has a history of having 2 non-STEMIs, 1 in December, 1 in January. His other medical problems are hypertension and hyperlipidemia.   ALLERGIES: None.   MEDICATIONS: Aspirin, losartan, Crestor, metoprolol and clopidogrel.  FAMILY HISTORY: Positive for coronary artery disease.   PHYSICAL EXAMINATION: GENERAL: He is alert, oriented x 3, in mild distress due to chest pain.  VITAL SIGNS: Stable.  NECK: No JVD.  LUNGS: Clear.  HEART: Regular  rate and rhythm. Normal S1, S2. No audible murmur.  ABDOMEN: Soft, nontender, positive bowel sounds.  EXTREMITIES: No pedal edema.  NEUROLOGIC: The patient appears to be intact.   LABORATORY AND DIAGNOSTIC DATA: EKG shows normal sinus rhythm, 62 beats per minute, no acute changes. His first troponin was negative, 0.02. The second troponin came back 0.06. The rest of the labs are unremarkable.   ASSESSMENT AND PLAN: The patient is having acute coronary syndrome/non-ST segment elevation myocardial infarction with history of coronary artery disease. Recall this is a patient who had a cardiac catheterization in December, only had moderate disease in the left anterior descending and diagonal 1 and in just 1 month, he changed from moderate disease to severe disease in diagonal 1 and ending up having a non-ST segment elevation myocardial infarction and percutaneous coronary intervention and stenting, so this patient has coronary artery disease which is very labile. He had already had a stress test in August because of evaluation for this coronary artery disease and it was normal. This time, however, he is having chest pain which appears to be more similar to what he had in January with acute coronary syndrome and elevated troponin. Advise cardiac catheterization.   Thank you very much for the referral.   ____________________________ Dionisio David, MD sak:jcm D: 12/08/2012 16:23:16 ET T: 12/08/2012 17:05:21 ET JOB#: [id number]  cc: Dionisio David, MD, <Dictator> Dionisio David MD ELECTRONICALLY SIGNED 12/11/2012 9:02

## 2014-05-21 NOTE — H&P (Signed)
PATIENT NAME:  Paul Bennett, Paul Bennett MR#:  [id number] DATE OF BIRTH:  [birth year]  DATE OF ADMISSION:  02/01/2012  PRIMARY CARE PHYSICIAN:  Dr. Maryan Puls.  CHIEF COMPLAINT:  Chest pain.     HISTORY OF PRESENT ILLNESS:  The patient is a 49 year old Caucasian male with past medical history of hypertension, hyperlipidemia, low testosterone syndrome, polycythemia and a recent history of non-STEMI in December 2013 status post left heart catheterization by Dr. Humphrey Rolls with cardiology, is presenting to the ER with a chief complaint of chest pain.  The patient is reporting that he was discharged from the hospital and then for the past two weeks he started doing workouts.  He denies any chest pain since he got discharged until today when he started working cardio which was quite intense today.  The patient has having chest pain in the midsternal area radiating to the left shoulder, not associated with any shortness of breath or diaphoresis.  Denies any dizziness either.  The pain lasted for 30 to 40 minutes.  The patient stopped doing cardio workout and then eventually he started feeling better. As patient was concerned, he came into the ER.  In the ER patient's initial troponin was negative at 0.02 with elevated total CK 219.  The repeat troponin was 0.31.  ER physician has consulted hospitalist team to admit this patient regarding elevated troponin.  A call was placed to on-call cardiology, Dr. Clayborn Bigness who has recommended to keep the patient, cycle cardiac biomarkers and to anticoagulate the patient.  During previous admission, patient had a cardiac catheterization done which has revealed left main proximal 30%, mid LAD 50% and left circumflex mid 40% RCA .  They have recommended conservative therapy and the patient was discharged home.  The patient's next appointment with cardiology is in 1 to 2 weeks from now as reported by him.   PAST MEDICAL HISTORY:  Hypertension, hyperlipidemia, non-STEMI, low testosterone  syndrome, polycythemia.   PAST SURGICAL HISTORY:  Status post cardiac cath, right biceps, left biceps, knee surgery, right shoulder reduction.   ALLERGIES:  No known drug allergies.   HOME MEDICATIONS:  Metoprolol 25  mg once daily, isosorbide mononitrate 1 tablet by mouth once daily, hydrochlorothiazide 1 tablet once daily, aspirin 81 mg once daily, Plavix 75 mg once daily, Crestor 40 mg once daily.  PSYCHOSOCIAL HISTORY:  Lives at home, lives with family.  Denies smoking, alcohol, or illicit drug usage.   FAMILY HISTORY:  Denies any family history of cardiac history.   REVIEW OF SYSTEMS:  CONSTITUTIONAL:  Denies any fever, fatigue, weakness, weight loss or weight gain.  EYES:  Denies any blurry vision, glaucoma, cataracts.  EARS, NOSE, THROAT:  Denies tinnitus, ear discharge, ear pain, postnasal drip, sinus pain.  RESPIRATORY:  Denies any cough, wheezing, hemoptysis, dyspnea, asthma, pneumonia.  CARDIOVASCULAR:  Complaining of chest pain when he was doing the cardiac workup,  lasted for 30 to 40 minutes and then spontaneously resolved.  Denies any palpitations, varicose veins, erythema.  GASTROINTESTINAL:  Denies any nausea, vomiting, diarrhea, abdominal pain, jaundice, rectal bleeding, constipation, hernia.  GENITOURINARY:  Denies any dysuria, hematuria, renal calculi.  ENDOCRINE:  Denies polyuria, polyphagia, polydipsia.  Denies any heat or cold intolerance.  MUSCULOSKELETAL:  Denies any pain in the neck, back, shoulders, knee or hip.  Complaining of chest pain intermittently.  Denies any limited activity.  NEUROLOGIC:  Denies any numbness, tremors, vertigo, ataxia, dementia, headache, migraine.  Denies insomnia, ADD, OCD, bipolar disorder.  INTEGUMENTARY:  Denies acne,  rash, lesions.   PHYSICAL EXAMINATION: VITAL SIGNS:  Pulse 74, respiratory rate 17, blood pressure 126/47, saturating 97% on 2 liters.  GENERAL APPEARANCE:  Not in acute distress, moderately built and moderately  nourished, answering all questions appropriately.  HEENT:  Normocephalic, atraumatic.  Pupils are equal, reacting to light and accommodation.  No conjunctival injection.  No scleral icterus.  Extraocular movements are intact.  Tympanic membranes are intact.  No discharge from the ears.  No sinus tenderness.  No postnasal drip.  No pharyngeal edema.  NECK:  Supple.  No JVD.  No carotid bruits.  No thyromegaly.  LUNGS:  Clear to auscultation bilaterally.  Moderate air entry.  No accessory muscle usage.  No anterior chest wall tenderness.  CARDIAC:  S1, S2 normal.  Regular rate and rhythm.  Point of maximum pulses intact.  GASTROINTESTINAL:  Soft.  Bowel sounds are positive in all four quadrants.  Nontender, nondistended.  No masses felt.  NEUROLOGIC:  Awake, alert and oriented x 3.  Motor and sensory are grossly intact.  Reflexes are 2 +.  SKIN:  No masses.  No lesions.  No rashes.  No acne.  MUSCULOSKELETAL:  No joint tenderness, effusion, erythema.  PSYCHIATRIC:  Normal mood and affect.   LABORATORY DATA AND IMAGING STUDIES:  Glucose 87, BUN 20, creatinine 1.47, sodium 141, potassium 3.4, chloride 106, CO2 26, GFR greater than 60, anion gap is 9, osmolality 283, calcium 8.6.  CK total 219.  CPK-MB 2.3, troponin T less than 0.02, repeat 0.31.  WBC 7.1, hemoglobin 17.7, platelet count 267,000.  A 12-lead EKG has revealed a normal sinus rhythm with a normal PR interval and QRS interval, nonspecific ST-T wave changes.   ASSESSMENT AND PLAN:  The patient is a 49 year old male presenting to the ER with chief complaint of chest pain while doing cardio workup with a recent past medical history of non-ST-elevation myocardial infarction in December, will be admitted with the following assessment and plan.  1.  Chest pain, most likely a non-ST-elevation myocardial infarction.  Plan, admit to CCU.  The patient will be on heparin drip after bolusing him.  ACS protocol with oxygen, nitroglycerin, aspirin,  beta-blocker and statin will be provided.  Cardiology consult is placed with Dr. Humphrey Rolls.  Cycle cardiac biomarkers.  2.  Hypertension.  Blood pressure is stable.  Resume his home medications and titrate as needed.  3.  Hyperlipidemia.  Continue statin.  4.  Polycythemia.  The patient is stable.  Outpatient followup with oncology is recommended.  5.  Low testosterone syndrome.  6.  Gastrointestinal and deep vein thrombosis prophylaxis.   7.  CODE STATUS:  THE PATIENT IS FULL CODE.    The plan of care was discussed in detail with the patient.  He is aware of the plan.   TOTAL CRITICAL CARE TIME SPENT:  Sixty minutes.    The patient will be turned over to Dr. Elijio Miles in the morning.    ____________________________ Nicholes Mango, MD ag:ea D: 02/01/2012 02:23:00 ET T: 02/01/2012 06:02:54 ET JOB#: [id number]  cc: Nicholes Mango, MD, <Dictator> Dionisio David, MD     Nicholes Mango MD ELECTRONICALLY SIGNED 02/04/2012 0:24

## 2014-05-21 NOTE — Discharge Summary (Signed)
DATE OF BIRTH:  1966/01/09  DATE OF ADMISSION:  02/01/2012 DATE OF DISCHARGE:  02/03/2012  HISTORY OF PRESENT ILLNESS:  The patient was admitted into the hospital with chest pain. He was admitted in this hospital on December 13th with ST-elevated myocardial infarction, and cardiac catheterization was done. It was decided to treat him medically. The patient was admitted in ICU.   DATA:  Initial blood sugar 87, BUN 20, creatinine 1.47, sodium 141, potassium 3.4. CK-MB 2.3; troponin less than 0.2, repeat 0.31. WBC count 7100 and hemoglobin 17.7. Electrocardiogram shows nonspecific ST-T changes.   HOSPITAL COURSE:  The patient was admitted into the hospital. Serial CPK isoenzymes and troponins were suggestive of crescendo angina, most likely a non-ST-elevated myocardial infarction. The patient was started on a heparin drip, beta blockers, nitroglycerin, oxygen. Dr. Ubaldo Glassing was consulted, and then interventional cardiologist, Dr. Fletcher Anon, was also consulted because of the high grade stenosis of the coronary artery. Dr. Fletcher Anon took the patient to the cath lab, and the patient was found to have a 99% first diagonal with 40% to 50% ostial left main not significant. A successful PCI was done, and drug-eluting stent was placed in the diagonal. The patient did well after the surgery and was discharged home on losartan and hydrochlorothiazide 100/12.5 one tablet p.o. daily, Plavix 75 mg p.o. daily, Aspirin 81 mg p.o. daily, isosorbide mononitrate 30 mg tablet p.o. daily, Crestor 40 mg p.o. daily, metoprolol 25 mg p.o. daily. The patient will be followed up by Dr. Neoma Laming in 1 week.   FINAL DIAGNOSES:  1.  Non-ST-elevated myocardial infarction.  2.  Status post angioplasty and stent placement of the obtuse marginal.  3.  Hypertension with hypertensive cardiovascular disease.  4.  Dyslipidemia.   ____________________________ Cletis Athens, MD jm:ms D:  02/17/2012 11:44:00 ET  T:  02/17/2012 18:00:26 ET  JOB#:   [id number]  cc: Dionisio David, MD  Cletis Athens MD ELECTRONICALLY SIGNED 03/06/2012 18:06

## 2014-10-23 IMAGING — CR DG CHEST 2V
1 series · 3 of 3 positions shown · non-contrast
Comparison: none

REASON FOR EXAM: chest pain
COMMENTS:

[Series 1: pa · 0.17mm/px · 3 of 3 slices shown]
[im 1/3]
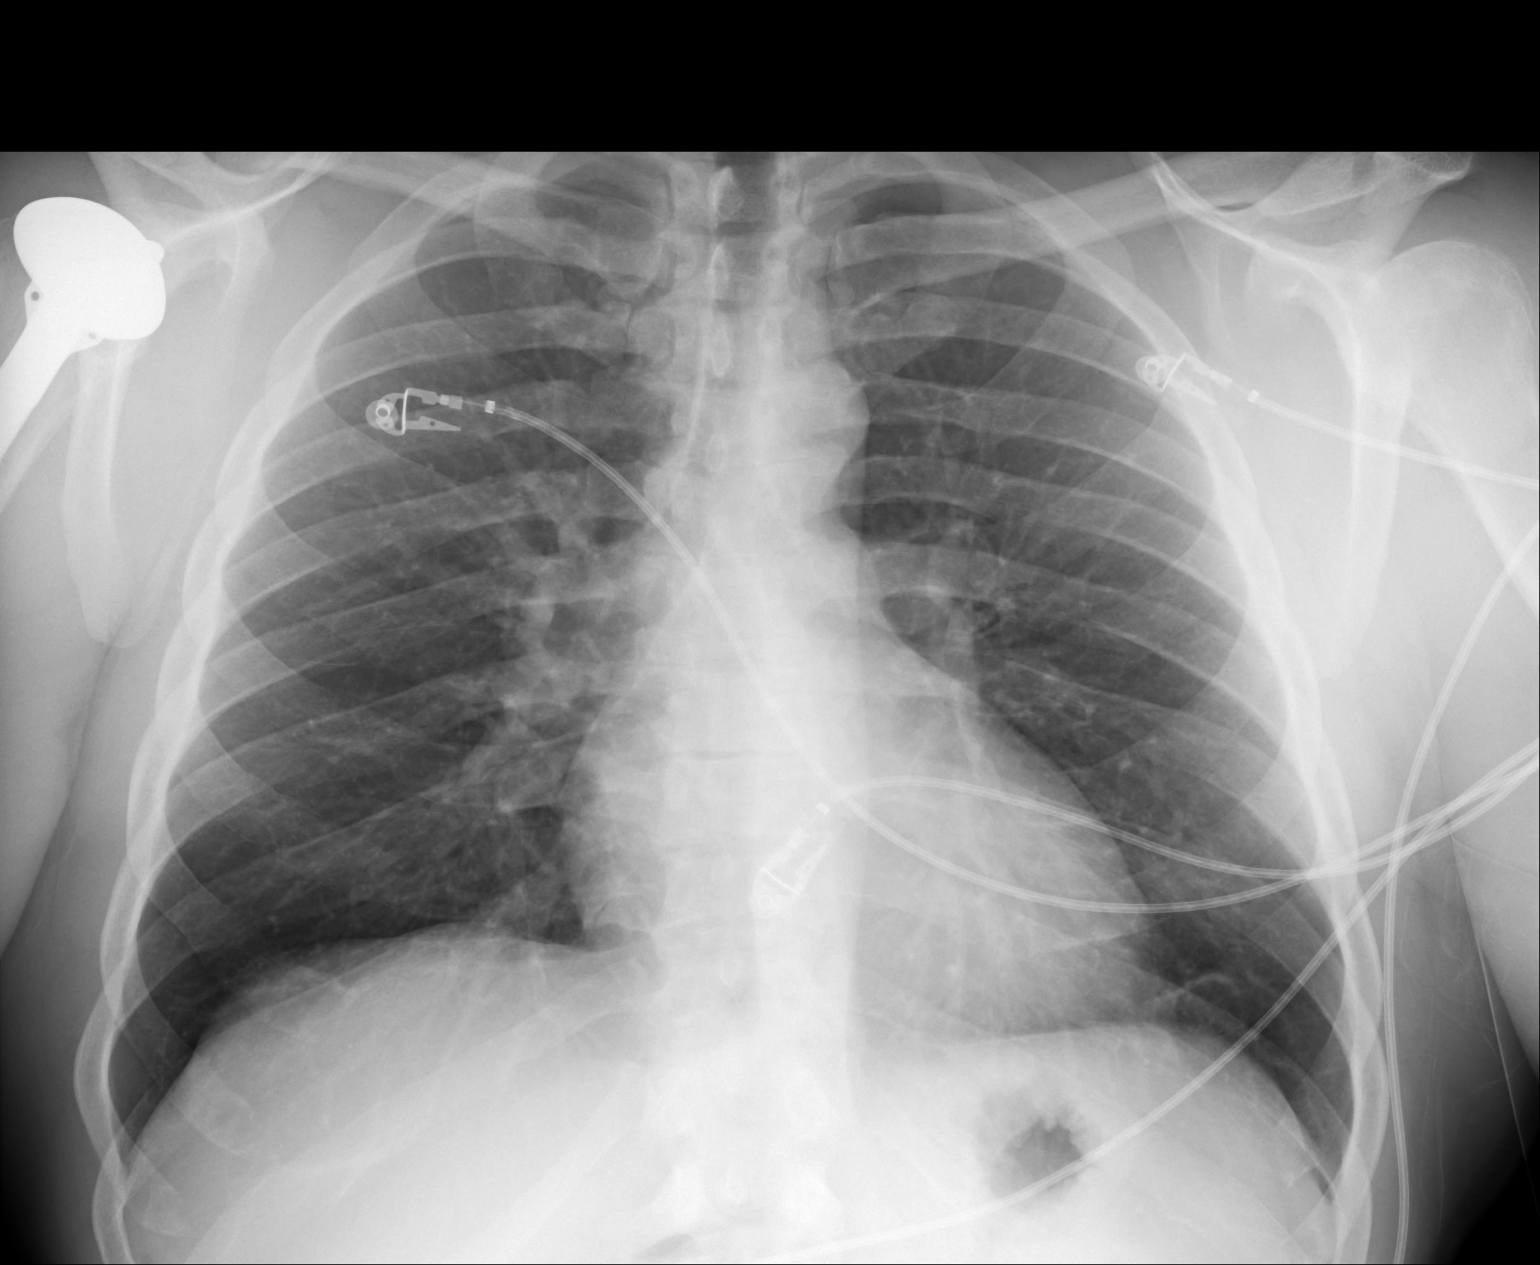
[im 2/3]
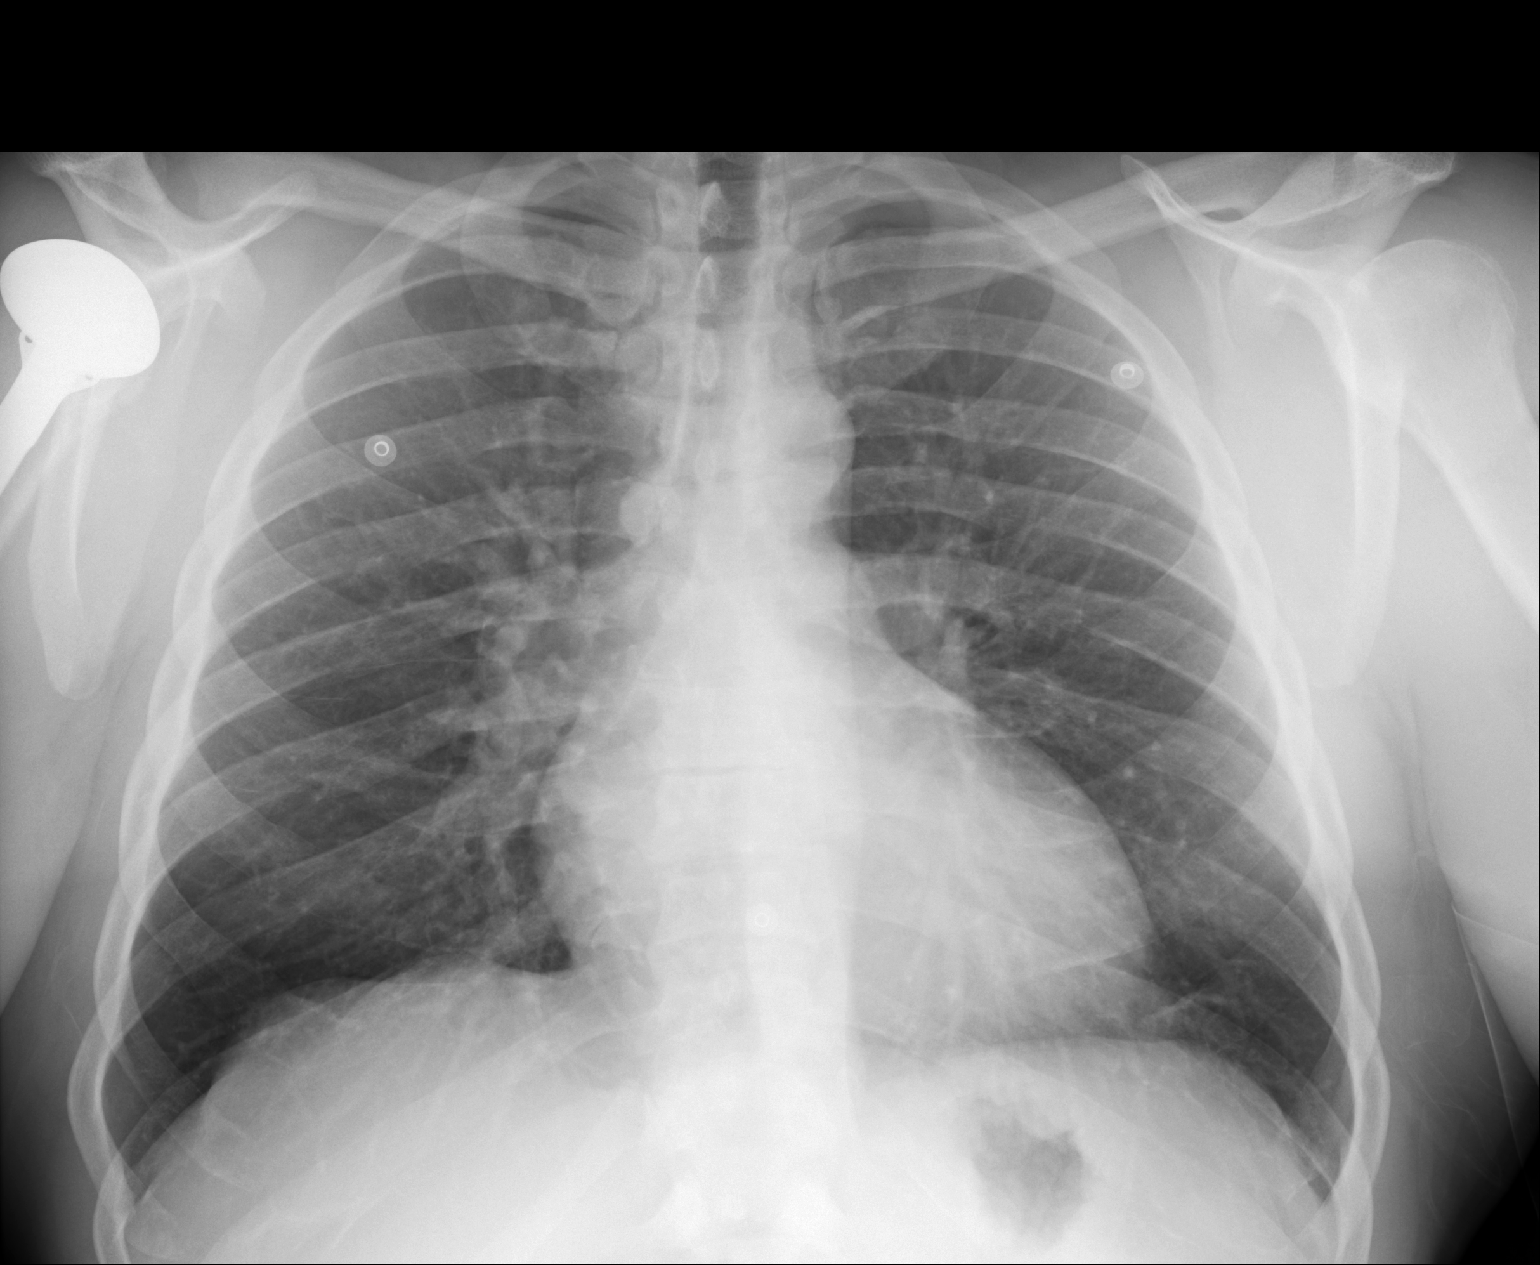
[im 3/3]
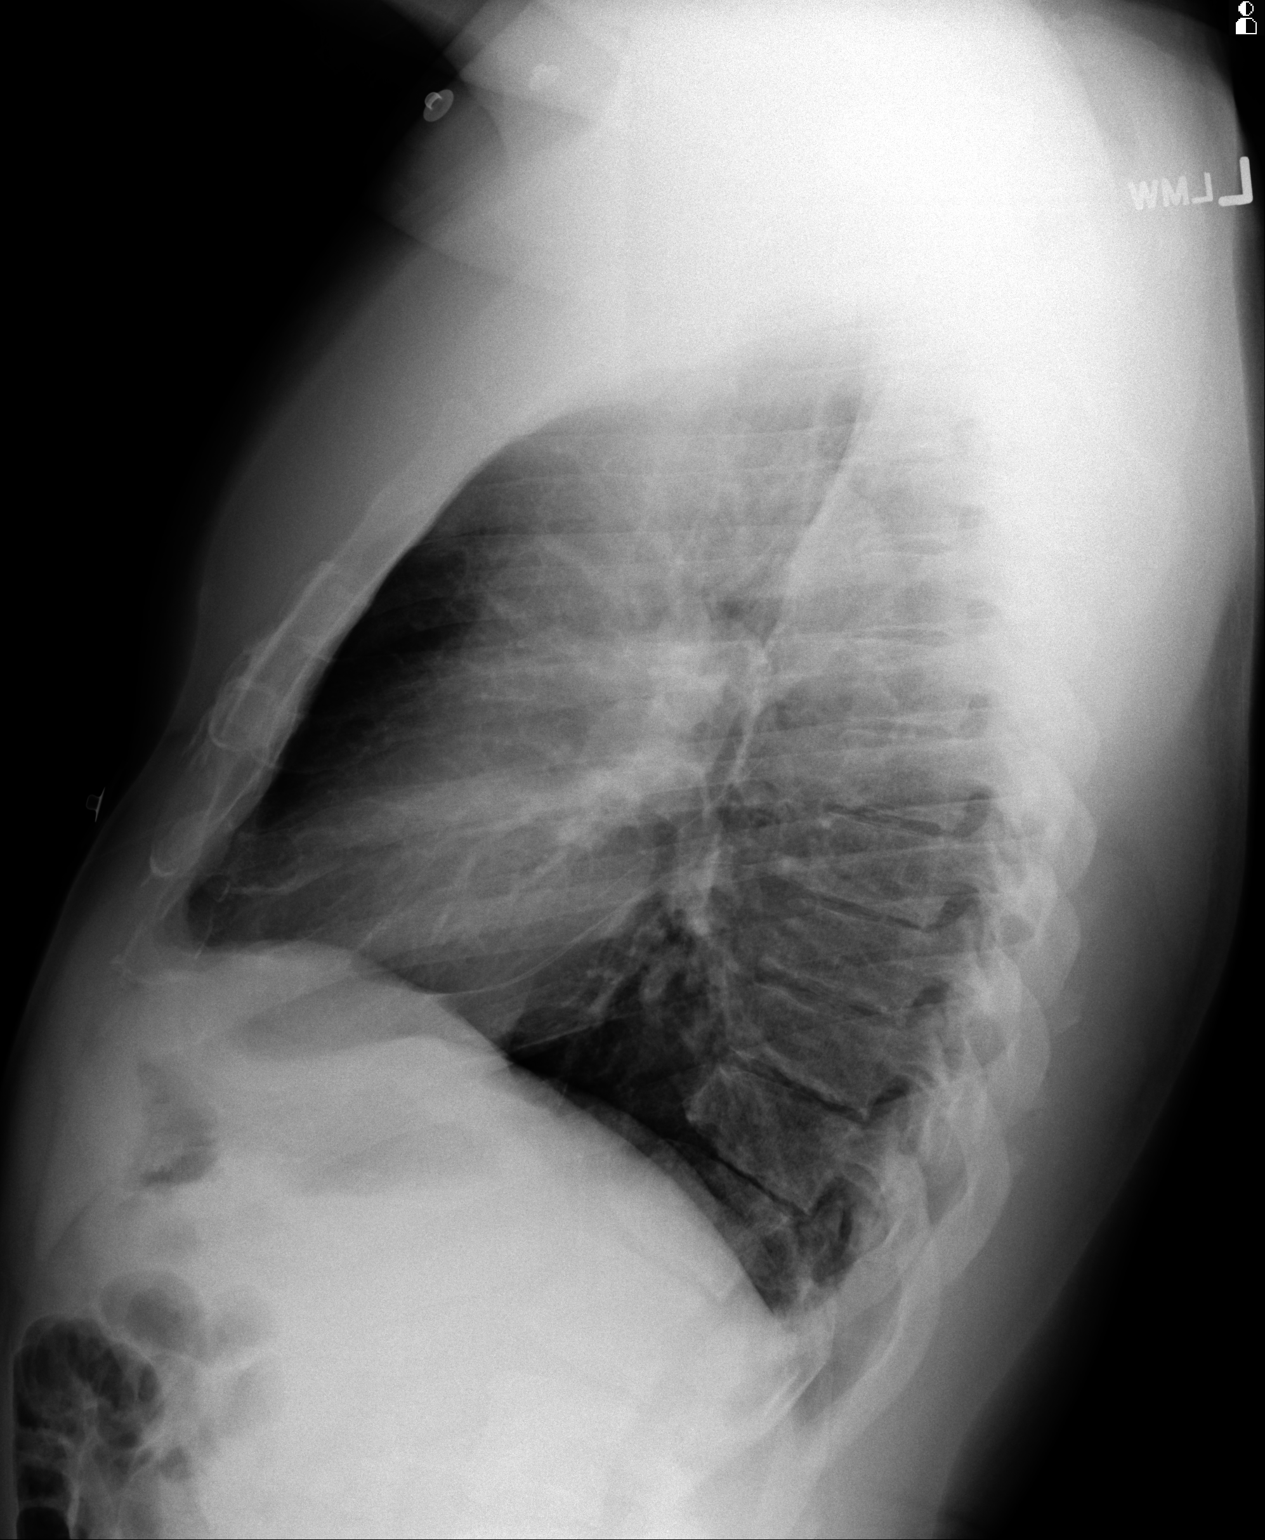

[3 of 3 positions shown; findings below may reference images not displayed]

PROCEDURE:     DXR - DXR CHEST PA (OR AP) AND LATERAL  - January 31, 2012  [DATE]

RESULT:     Comparison is made to the study 07 January, 2012.

The lungs are clear. The heart and pulmonary vessels are normal. The bony
and mediastinal structures are unremarkable. There is no effusion. There is
no pneumothorax or evidence of congestive failure.
IMPRESSION: No acute cardiopulmonary disease.

[REDACTED]

## 2015-11-03 IMAGING — CR DG CHEST 2V
1 series · 3 of 3 positions shown · non-contrast
Comparison: 12/08/2012

CLINICAL DATA: Chest pain

EXAM:
CHEST  2 VIEW

[Series 1: pa · 0.17mm/px · 3 of 3 slices shown]
[im 1/3]
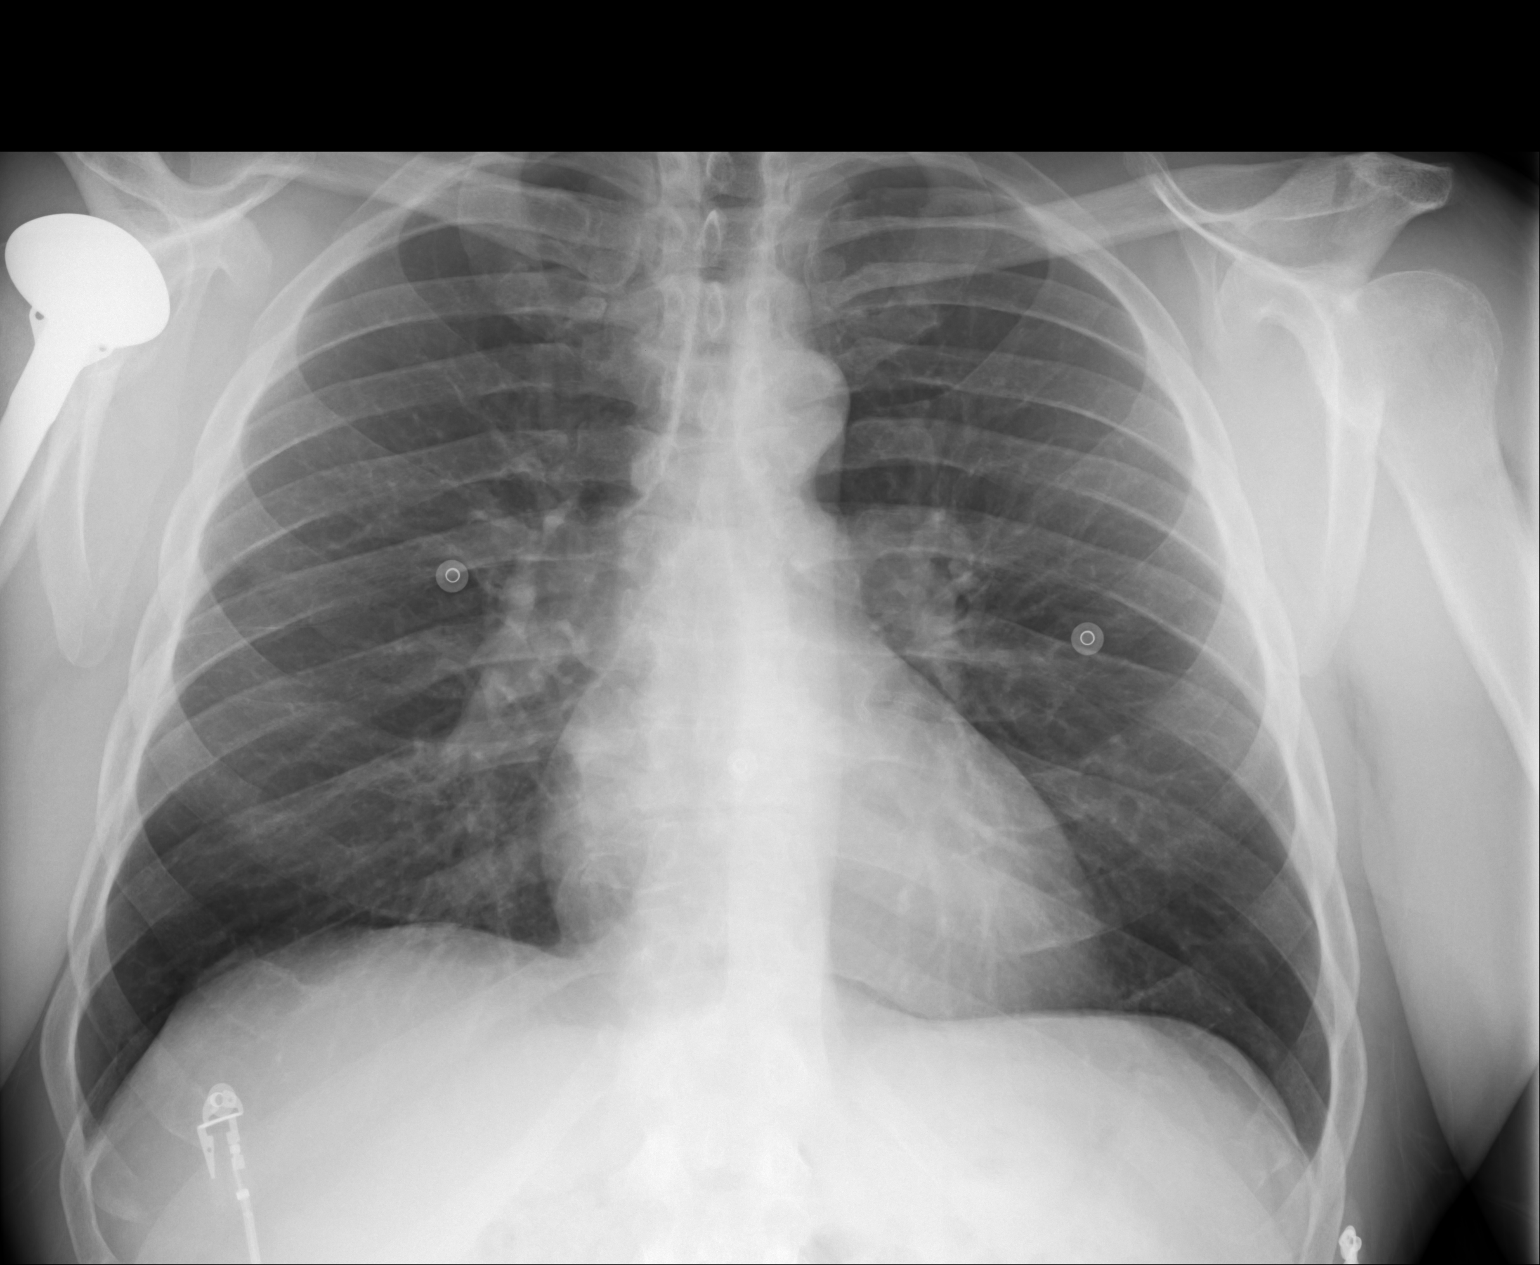
[im 2/3]
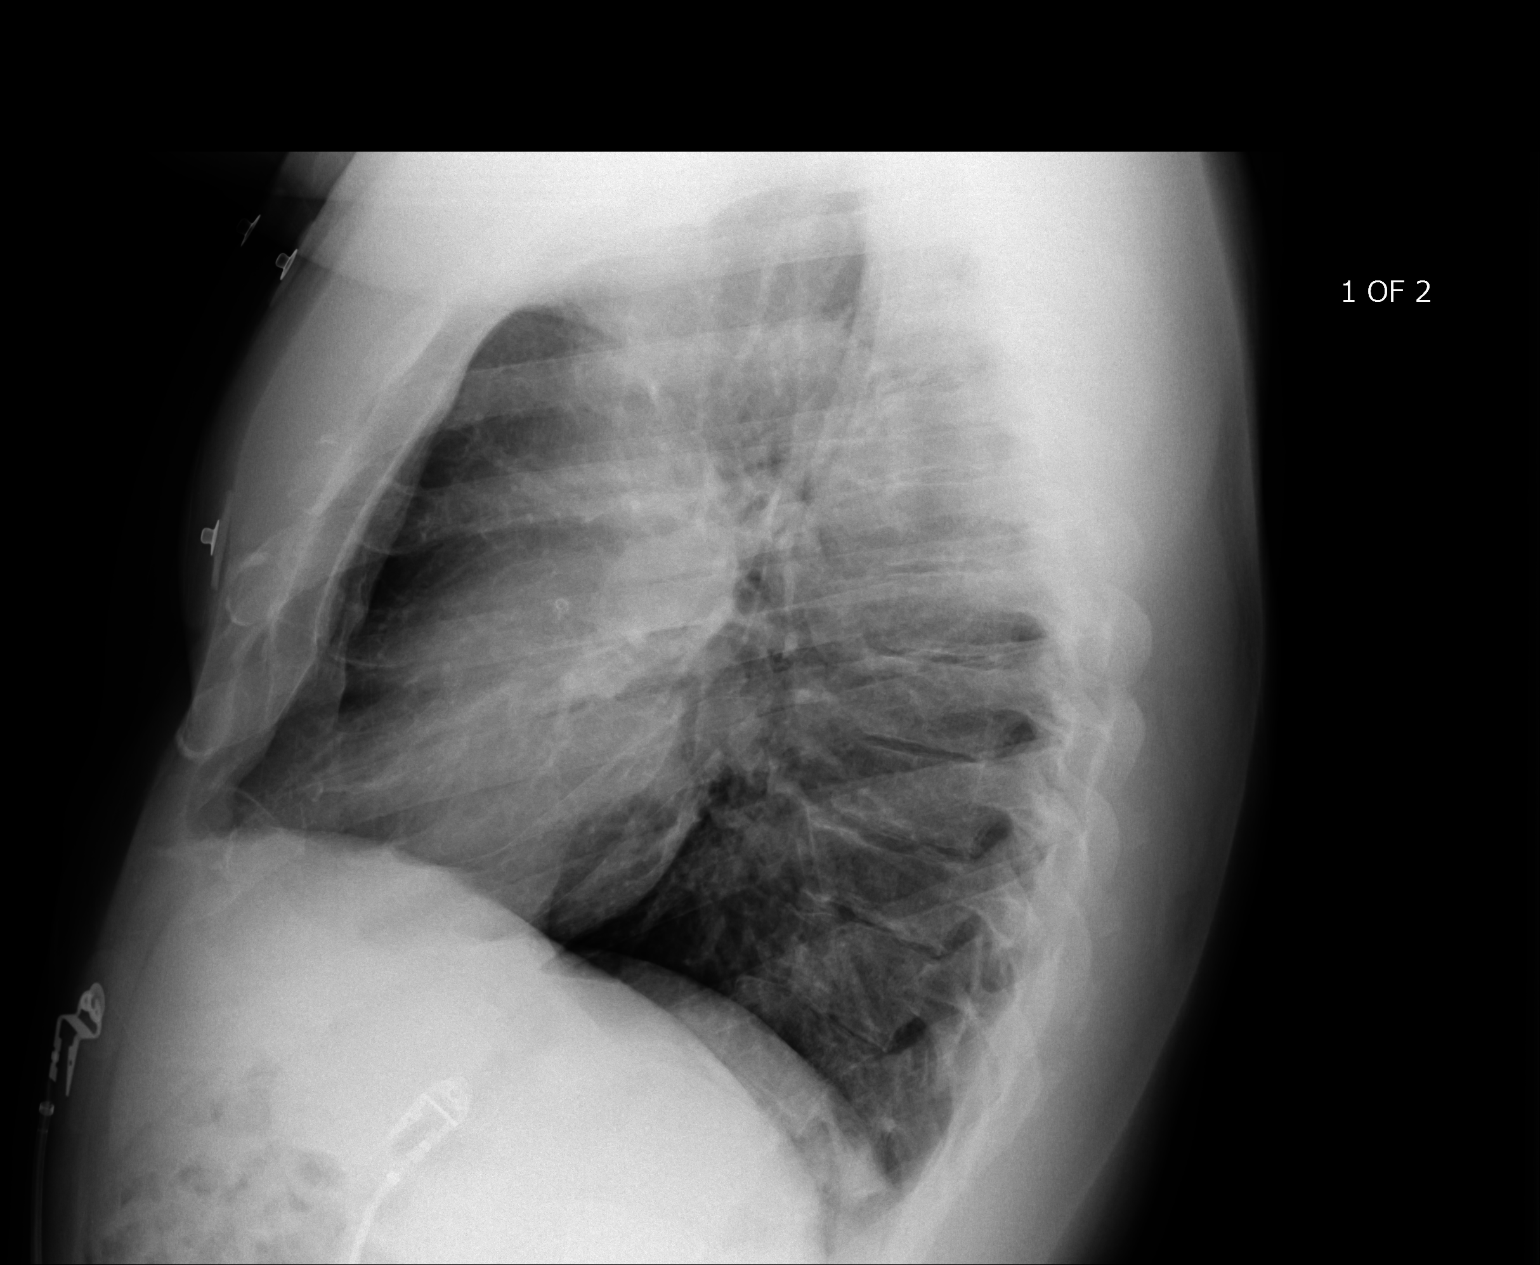
[im 3/3]
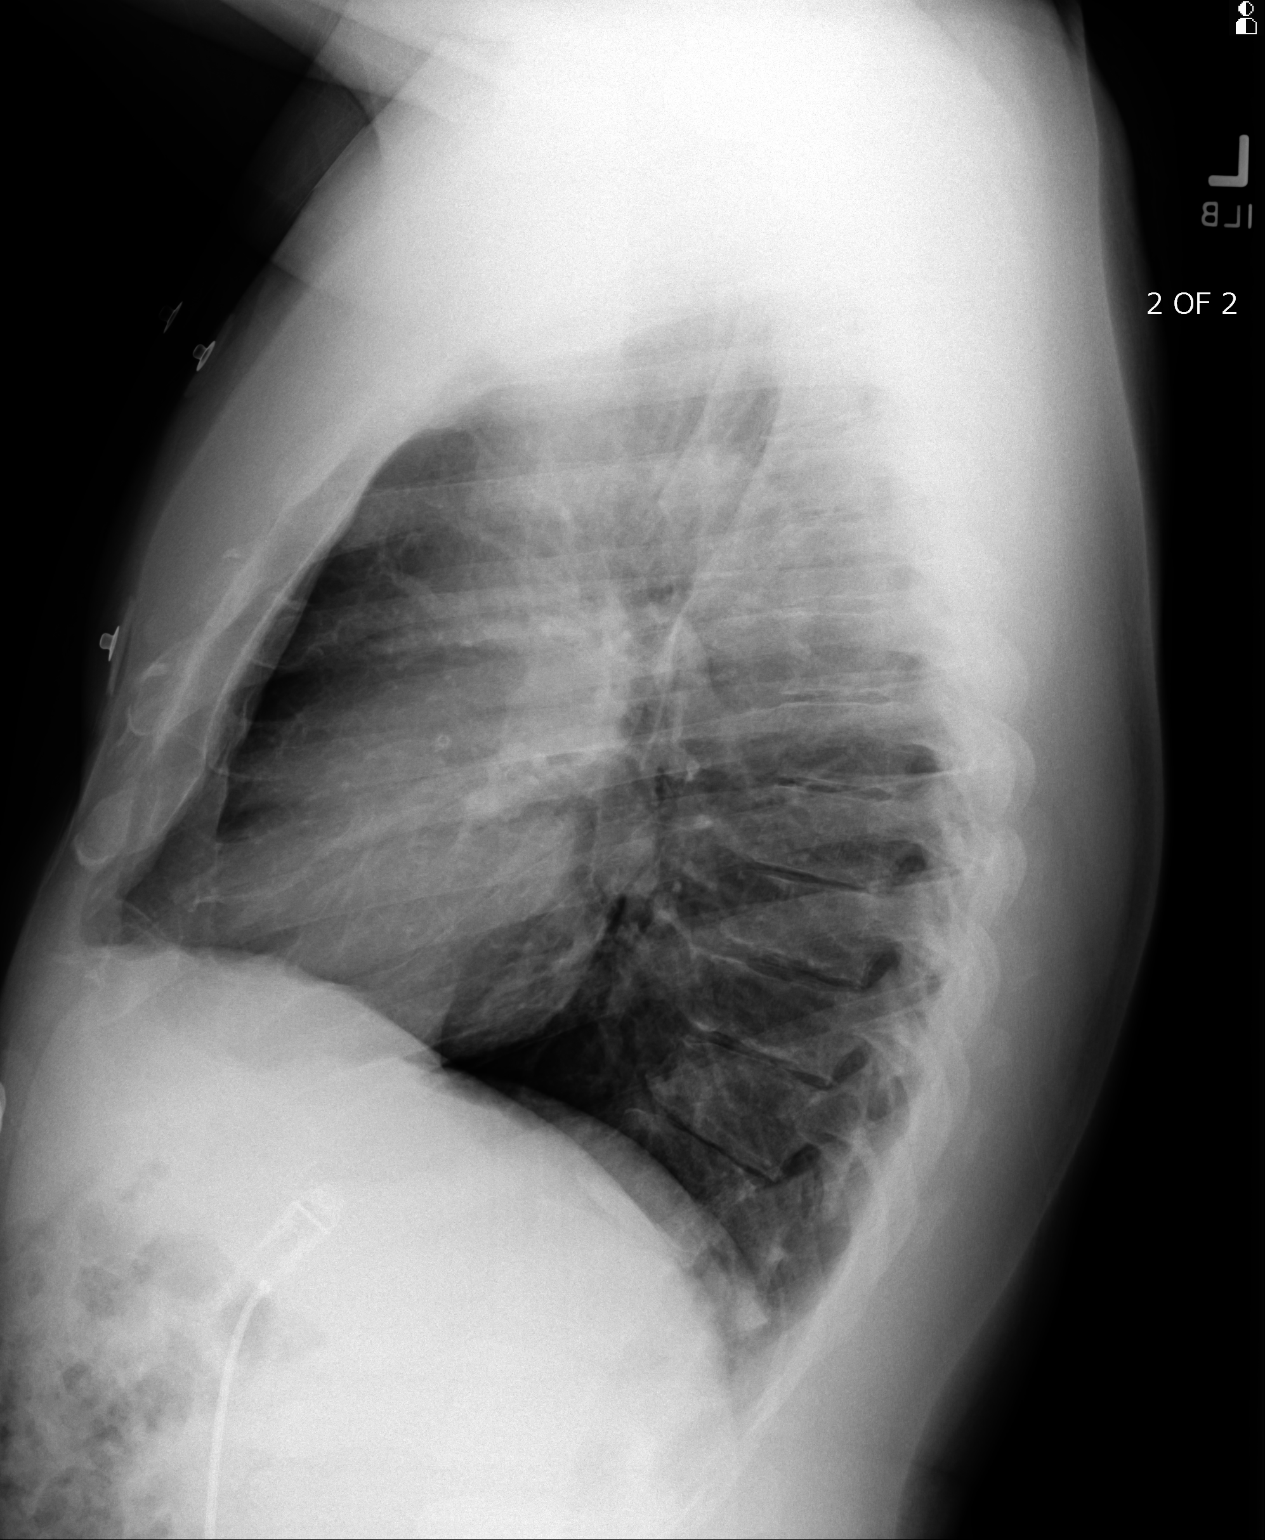

[3 of 3 positions shown; findings below may reference images not displayed]

FINDINGS: The heart size and mediastinal contours are within normal limits.
Both lungs are clear. The visualized skeletal structures are
unremarkable.
IMPRESSION: No active cardiopulmonary disease.

## 2017-03-19 ENCOUNTER — Encounter: Payer: Self-pay | Admitting: *Deleted

## 2017-03-20 ENCOUNTER — Ambulatory Visit
Admission: RE | Admit: 2017-03-20 | Discharge: 2017-03-20 | Disposition: A | Discharge status: Home / Self Care | Payer: Managed Care, Other (non HMO) | Source: Ambulatory Visit | Attending: Unknown Physician Specialty | Admitting: Unknown Physician Specialty

## 2017-03-20 ENCOUNTER — Ambulatory Visit: Payer: Managed Care, Other (non HMO) | Admitting: Anesthesiology

## 2017-03-20 ENCOUNTER — Other Ambulatory Visit: Payer: Self-pay

## 2017-03-20 ENCOUNTER — Encounter: Payer: Self-pay | Admitting: *Deleted

## 2017-03-20 ENCOUNTER — Encounter
Admission: RE | Disposition: A | Discharge status: Home / Self Care | Payer: Self-pay | Source: Ambulatory Visit | Attending: Unknown Physician Specialty

## 2017-03-20 DIAGNOSIS — Z96619 Presence of unspecified artificial shoulder joint: Secondary | ICD-10-CM | POA: Insufficient documentation

## 2017-03-20 DIAGNOSIS — K640 First degree hemorrhoids: Secondary | ICD-10-CM | POA: Diagnosis not present

## 2017-03-20 DIAGNOSIS — I509 Heart failure, unspecified: Secondary | ICD-10-CM | POA: Diagnosis not present

## 2017-03-20 DIAGNOSIS — I110 Hypertensive heart disease with heart failure: Secondary | ICD-10-CM | POA: Diagnosis not present

## 2017-03-20 DIAGNOSIS — G4730 Sleep apnea, unspecified: Secondary | ICD-10-CM | POA: Insufficient documentation

## 2017-03-20 DIAGNOSIS — E785 Hyperlipidemia, unspecified: Secondary | ICD-10-CM | POA: Diagnosis not present

## 2017-03-20 DIAGNOSIS — Z955 Presence of coronary angioplasty implant and graft: Secondary | ICD-10-CM | POA: Insufficient documentation

## 2017-03-20 DIAGNOSIS — Z1211 Encounter for screening for malignant neoplasm of colon: Secondary | ICD-10-CM | POA: Insufficient documentation

## 2017-03-20 DIAGNOSIS — I2510 Atherosclerotic heart disease of native coronary artery without angina pectoris: Secondary | ICD-10-CM | POA: Diagnosis not present

## 2017-03-20 DIAGNOSIS — D124 Benign neoplasm of descending colon: Secondary | ICD-10-CM | POA: Diagnosis not present

## 2017-03-20 DIAGNOSIS — Z8249 Family history of ischemic heart disease and other diseases of the circulatory system: Secondary | ICD-10-CM | POA: Insufficient documentation

## 2017-03-20 DIAGNOSIS — Z7982 Long term (current) use of aspirin: Secondary | ICD-10-CM | POA: Insufficient documentation

## 2017-03-20 DIAGNOSIS — Z79899 Other long term (current) drug therapy: Secondary | ICD-10-CM | POA: Diagnosis not present

## 2017-03-20 HISTORY — DX: Hyperlipidemia, unspecified: E78.5

## 2017-03-20 HISTORY — DX: Sleep apnea, unspecified: G47.30

## 2017-03-20 HISTORY — DX: Atherosclerotic heart disease of native coronary artery without angina pectoris: I25.10

## 2017-03-20 HISTORY — PX: COLONOSCOPY WITH PROPOFOL: SHX5780

## 2017-03-20 HISTORY — DX: Heart failure, unspecified: I50.9

## 2017-03-20 HISTORY — DX: Secondary polycythemia: D75.1

## 2017-03-20 HISTORY — DX: Essential (primary) hypertension: I10

## 2017-03-20 HISTORY — DX: Presence of coronary angioplasty implant and graft: Z95.5

## 2017-03-20 HISTORY — DX: Presence of unspecified artificial shoulder joint: Z96.619

## 2017-03-20 SURGERY — COLONOSCOPY WITH PROPOFOL
Anesthesia: General

## 2017-03-20 MED ORDER — PHENYLEPHRINE HCL 10 MG/ML IJ SOLN
INTRAMUSCULAR | Status: DC | PRN
  Administered 2017-03-20 (×3): 100 ug via INTRAVENOUS

## 2017-03-20 MED ORDER — PIPERACILLIN-TAZOBACTAM 3.375 G IVPB
INTRAVENOUS | Status: AC
  Administered 2017-03-20: 3.375 g via INTRAVENOUS
  Filled 2017-03-20: qty 50

## 2017-03-20 MED ORDER — FENTANYL CITRATE (PF) 100 MCG/2ML IJ SOLN
INTRAMUSCULAR | Status: AC
  Filled 2017-03-20: qty 2

## 2017-03-20 MED ORDER — EPHEDRINE SULFATE 50 MG/ML IJ SOLN
INTRAMUSCULAR | Status: DC | PRN
  Administered 2017-03-20: 10 mg via INTRAVENOUS
  Administered 2017-03-20: 5 mg via INTRAVENOUS

## 2017-03-20 MED ORDER — PROPOFOL 500 MG/50ML IV EMUL
INTRAVENOUS | Status: DC | PRN
  Administered 2017-03-20: 180 ug/kg/min via INTRAVENOUS

## 2017-03-20 MED ORDER — MIDAZOLAM HCL 2 MG/2ML IJ SOLN
INTRAMUSCULAR | Status: AC
  Filled 2017-03-20: qty 2

## 2017-03-20 MED ORDER — PROPOFOL 10 MG/ML IV BOLUS
INTRAVENOUS | Status: DC | PRN
  Administered 2017-03-20: 40 mg via INTRAVENOUS

## 2017-03-20 MED ORDER — SODIUM CHLORIDE 0.9 % IV SOLN
INTRAVENOUS | Status: DC
  Administered 2017-03-20 (×2): via INTRAVENOUS

## 2017-03-20 MED ORDER — SODIUM CHLORIDE 0.9 % IV SOLN: INTRAVENOUS | Status: DC

## 2017-03-20 MED ORDER — FENTANYL CITRATE (PF) 100 MCG/2ML IJ SOLN
INTRAMUSCULAR | Status: DC | PRN
  Administered 2017-03-20: 50 ug via INTRAVENOUS

## 2017-03-20 MED ORDER — MIDAZOLAM HCL 2 MG/2ML IJ SOLN
INTRAMUSCULAR | Status: DC | PRN
  Administered 2017-03-20: 2 mg via INTRAVENOUS

## 2017-03-20 MED ORDER — PROPOFOL 500 MG/50ML IV EMUL
INTRAVENOUS | Status: AC
  Filled 2017-03-20: qty 50

## 2017-03-20 MED ORDER — PIPERACILLIN-TAZOBACTAM 3.375 G IVPB
3.3750 g | Freq: Once | INTRAVENOUS | Status: AC
  Administered 2017-03-20: 3.375 g via INTRAVENOUS

## 2017-03-20 NOTE — Transfer of Care (Signed)
Immediate Anesthesia Transfer of Care Note  Patient: Paul Bennett  Procedure(s) Performed: COLONOSCOPY WITH PROPOFOL (N/A )  Patient Location: PACU  Anesthesia Type:General  Level of Consciousness: awake  Airway & Oxygen Therapy: Patient Spontanous Breathing and Patient connected to nasal cannula oxygen  Post-op Assessment: Report given to RN and Post -op Vital signs reviewed and stable  Post vital signs: Reviewed and stable  Last Vitals:  Vitals:   03/20/17 0903 03/20/17 1007  BP: 106/68   Pulse: 65   Resp: 16   Temp: (!) 36.1 C (P) 36.7 C  SpO2: 99%     Last Pain:  Vitals:   03/20/17 1007  TempSrc: (P) Tympanic      Patients Stated Pain Goal: 9 (35/67/01 4103)  Complications: No apparent anesthesia complications

## 2017-03-20 NOTE — Anesthesia Preprocedure Evaluation (Addendum)
Anesthesia Evaluation  Patient identified by MRN, date of birth, ID band Patient awake    Reviewed: Allergy & Precautions, H&P , NPO status , reviewed documented beta blocker date and time   Airway Mallampati: II  TM Distance: >3 FB     Dental no notable dental hx.    Pulmonary sleep apnea ,    Pulmonary exam normal        Cardiovascular hypertension, + CAD, + Cardiac Stents and +CHF  Normal cardiovascular exam     Neuro/Psych    GI/Hepatic   Endo/Other    Renal/GU      Musculoskeletal   Abdominal   Peds  Hematology   Anesthesia Other Findings Stents 2014, negative stress test since, lifts weights 5 days/week, no CP  Reproductive/Obstetrics                            Anesthesia Physical Anesthesia Plan  ASA: II  Anesthesia Plan: General   Post-op Pain Management:    Induction:   PONV Risk Score and Plan: 2 and Propofol infusion  Airway Management Planned:   Additional Equipment:   Intra-op Plan:   Post-operative Plan:   Informed Consent: I have reviewed the patients History and Physical, chart, labs and discussed the procedure including the risks, benefits and alternatives for the proposed anesthesia with the patient or authorized representative who has indicated his/her understanding and acceptance.   Dental Advisory Given  Plan Discussed with: CRNA  Anesthesia Plan Comments:        Anesthesia Quick Evaluation

## 2017-03-20 NOTE — Anesthesia Procedure Notes (Signed)
Date/Time: 03/20/2017 9:45 AM Performed by: Allean Found, CRNA Pre-anesthesia Checklist: Patient identified, Emergency Drugs available, Suction available, Patient being monitored and Timeout performed Patient Re-evaluated:Patient Re-evaluated prior to induction Oxygen Delivery Method: Nasal cannula Induction Type: IV induction Placement Confirmation: positive ETCO2 and breath sounds checked- equal and bilateral

## 2017-03-20 NOTE — Anesthesia Post-op Follow-up Note (Signed)
Anesthesia QCDR form completed.        

## 2017-03-20 NOTE — Anesthesia Postprocedure Evaluation (Signed)
Anesthesia Post Note  Patient: Paul Bennett  Procedure(s) Performed: COLONOSCOPY WITH PROPOFOL (N/A )  Patient location during evaluation: Endoscopy Anesthesia Type: General Level of consciousness: awake and alert Pain management: pain level controlled Vital Signs Assessment: post-procedure vital signs reviewed and stable Respiratory status: spontaneous breathing, nonlabored ventilation, respiratory function stable and patient connected to nasal cannula oxygen Cardiovascular status: blood pressure returned to baseline and stable Postop Assessment: no apparent nausea or vomiting Anesthetic complications: no     Last Vitals:  Vitals:   03/20/17 1017 03/20/17 1038  BP: 102/66 (!) 95/58  Pulse:    Resp:    Temp:    SpO2:      Last Pain:  Vitals:   03/20/17 1007  TempSrc: Tympanic                 Brooklynne Pereida Harvie Heck

## 2017-03-20 NOTE — H&P (Signed)
Primary Care Physician:  Jodi Marble, MD Primary Gastroenterologist:  Dr. Vira Agar  Pre-Procedure History & Physical: HPI:  Paul Bennett is a 52 y.o. male is here for an screening colonoscopy.   Past Medical History:  Diagnosis Date  . CHF (congestive heart failure) ([place])   . Coronary artery disease   . History of heart artery stent   . Hyperlipidemia   . Hypertension   . Presence of artificial shoulder joint   . Secondary polycythemia   . Sleep apnea     Past Surgical History:  Procedure Laterality Date  . APPENDECTOMY    . chest lump    . HERNIA REPAIR     Inguinal  . TOTAL SHOULDER REPLACEMENT Right     Prior to Admission medications   Medication Sig Start Date End Date Taking? Authorizing Provider  aspirin EC 81 MG tablet Take 81 mg by mouth daily.   Yes [provider]  losartan (COZAAR) 50 MG tablet Take 50 mg by mouth daily.   Yes [provider]  Omega-3 Fatty Acids (FISH OIL PO) Take 120-180 mg by mouth daily.   Yes [provider]  Polyethylene Glycol 3350 (MIRALAX PO) Take 255 g by mouth once.   Yes [provider]  rosuvastatin (CRESTOR) 40 MG tablet Take 40 mg by mouth daily.   Yes [provider]  ticagrelor (BRILINTA) 60 MG TABS tablet Take by mouth daily.   Yes [provider]    Allergies as of 12/17/2016  . (Not on File)    Family History  Problem Relation Age of Onset  . Hypertension Mother     Social History   Socioeconomic History  . Marital status: Married    Spouse name: Not on file  . Number of children: Not on file  . Years of education: Not on file  . Highest education level: Not on file  Social Needs  . Financial resource strain: Not on file  . Food insecurity - worry: Not on file  . Food insecurity - inability: Not on file  . Transportation needs - medical: Not on file  . Transportation needs - non-medical: Not on file  Occupational History  . Not on file   Tobacco Use  . Smoking status: Never Smoker  . Smokeless tobacco: Never Used  Substance and Sexual Activity  . Alcohol use: Yes    Alcohol/week: 3.6 oz    Types: 6 Cans of beer per week    Comment: 2 times a week   . Drug use: No  . Sexual activity: Yes  Other Topics Concern  . Not on file  Social History Narrative  . Not on file    Review of Systems: See HPI, otherwise negative ROS  Physical Exam: BP 106/68   Pulse 65   Temp (!) 96.9 F (36.1 C) (Tympanic)   Resp 16   Ht 5' 3.5" (1.613 m)   Wt 83.9 kg (185 lb)   SpO2 99%   BMI 32.26 kg/m  General:   Alert,  pleasant and cooperative in NAD Head:  Normocephalic and atraumatic. Neck:  Supple; no masses or thyromegaly. Lungs:  Clear throughout to auscultation.    Heart:  Regular rate and rhythm. Abdomen:  Soft, nontender and nondistended. Normal bowel sounds, without guarding, and without rebound.   Neurologic:  Alert and  oriented x4;  grossly normal neurologically.  Impression/Plan: Paul Bennett is here for an colonoscopy to be performed for screening.  Risks,  benefits, limitations, and alternatives regarding  colonoscopy have been reviewed with the patient.  Questions have been answered.  All parties agreeable.   Gaylyn Cheers, MD  03/20/2017, 9:32 AM

## 2017-03-20 NOTE — Op Note (Signed)
[facility] Gastroenterology Patient Name: Paul Bennett Procedure Date: 03/20/2017 9:35 AM MRN: [id number] Account #: [id number] Date of Birth: 1965/09/05 Admit Type: Outpatient Age: 52 Room: [facility] ENDO ROOM 4 Gender: Male Note Status: Finalized Procedure:            Colonoscopy Indications:          Screening for colorectal malignant neoplasm Providers:            Manya Silvas, MD Referring MD:         Venetia Maxon. Elijio Miles, MD (Referring MD) Medicines:            Propofol per Anesthesia Complications:        No immediate complications. Procedure:            Pre-Anesthesia Assessment:                       - After reviewing the risks and benefits, the patient                        was deemed in satisfactory condition to undergo the                        procedure.                       After obtaining informed consent, the colonoscope was                        passed under direct vision. Throughout the procedure,                        the patient's blood pressure, pulse, and oxygen                        saturations were monitored continuously. The                        Colonoscope was introduced through the anus and                        advanced to the the cecum, identified by appendiceal                        orifice and ileocecal valve. The colonoscopy was                        performed without difficulty. The patient tolerated the                        procedure well. The quality of the bowel preparation                        was excellent. Findings:      A diminutive polyp was found in the distal descending colon. The polyp       was sessile. The polyp was removed with a jumbo cold forceps. Resection       and retrieval were complete.      Internal hemorrhoids were found during endoscopy. The hemorrhoids were       small and Grade I (internal hemorrhoids that do not prolapse).      The  exam was otherwise without abnormality. Impression:            - One diminutive polyp in the distal descending colon,                        removed with a jumbo cold forceps. Resected and                        retrieved.                       - Internal hemorrhoids.                       - The examination was otherwise normal. Recommendation:       - Await pathology results. Manya Silvas, MD 03/20/2017 10:01:45 AM This report has been signed electronically. Number of Addenda: 0 Note Initiated On: 03/20/2017 9:35 AM Scope Withdrawal Time: 0 hours 10 minutes 32 seconds  Total Procedure Duration: 0 hours 15 minutes 32 seconds       [facility]

## 2017-03-21 ENCOUNTER — Encounter: Payer: Self-pay | Admitting: Unknown Physician Specialty

## 2017-03-22 LAB — SURGICAL PATHOLOGY

## 2018-08-27 ENCOUNTER — Emergency Department
Admission: EM | Admit: 2018-08-27 | Discharge: 2018-08-30 | Disposition: E | Payer: Managed Care, Other (non HMO) | Attending: Emergency Medicine | Admitting: Emergency Medicine

## 2018-08-27 ENCOUNTER — Emergency Department: Payer: Managed Care, Other (non HMO)

## 2018-08-27 ENCOUNTER — Emergency Department: Admit: 2018-08-27 | Payer: Managed Care, Other (non HMO) | Admitting: Cardiology

## 2018-08-27 ENCOUNTER — Encounter: Admission: EM | Disposition: E | Payer: Self-pay | Source: Home / Self Care | Attending: Emergency Medicine

## 2018-08-27 DIAGNOSIS — Z79899 Other long term (current) drug therapy: Secondary | ICD-10-CM | POA: Diagnosis not present

## 2018-08-27 DIAGNOSIS — Z03818 Encounter for observation for suspected exposure to other biological agents ruled out: Secondary | ICD-10-CM | POA: Diagnosis not present

## 2018-08-27 DIAGNOSIS — Z7982 Long term (current) use of aspirin: Secondary | ICD-10-CM | POA: Diagnosis not present

## 2018-08-27 DIAGNOSIS — I2510 Atherosclerotic heart disease of native coronary artery without angina pectoris: Secondary | ICD-10-CM | POA: Diagnosis not present

## 2018-08-27 DIAGNOSIS — I213 ST elevation (STEMI) myocardial infarction of unspecified site: Secondary | ICD-10-CM

## 2018-08-27 DIAGNOSIS — I509 Heart failure, unspecified: Secondary | ICD-10-CM | POA: Diagnosis not present

## 2018-08-27 DIAGNOSIS — I110 Hypertensive heart disease with heart failure: Secondary | ICD-10-CM | POA: Insufficient documentation

## 2018-08-27 DIAGNOSIS — R569 Unspecified convulsions: Secondary | ICD-10-CM | POA: Insufficient documentation

## 2018-08-27 DIAGNOSIS — R079 Chest pain, unspecified: Secondary | ICD-10-CM | POA: Diagnosis present

## 2018-08-27 DIAGNOSIS — Z955 Presence of coronary angioplasty implant and graft: Secondary | ICD-10-CM | POA: Diagnosis not present

## 2018-08-27 LAB — GLUCOSE, CAPILLARY: Glucose-Capillary: 157 mg/dL — ABNORMAL HIGH (ref 70–99)

## 2018-08-27 LAB — SARS CORONAVIRUS 2 BY RT PCR (HOSPITAL ORDER, PERFORMED IN ~~LOC~~ HOSPITAL LAB): SARS Coronavirus 2: NEGATIVE

## 2018-08-27 SURGERY — CORONARY/GRAFT ACUTE MI REVASCULARIZATION
Anesthesia: Moderate Sedation

## 2018-08-27 MED ORDER — ATROPINE SULFATE 1 MG/ML IJ SOLN
INTRAMUSCULAR | Status: AC | PRN
  Administered 2018-08-27: .5 mg via INTRAVENOUS

## 2018-08-27 MED ORDER — HEPARIN SODIUM (PORCINE) 1000 UNIT/ML IJ SOLN
INTRAMUSCULAR | Status: AC
  Filled 2018-08-27: qty 1

## 2018-08-27 MED ORDER — HEPARIN (PORCINE) IN NACL 1000-0.9 UT/500ML-% IV SOLN
INTRAVENOUS | Status: AC
  Filled 2018-08-27: qty 1000

## 2018-08-27 MED ORDER — NITROGLYCERIN 5 MG/ML IV SOLN
INTRAVENOUS | Status: AC
  Filled 2018-08-27: qty 10

## 2018-08-27 MED ORDER — VERAPAMIL HCL 2.5 MG/ML IV SOLN
INTRAVENOUS | Status: AC
  Filled 2018-08-27: qty 2

## 2018-08-27 MED ORDER — LORAZEPAM 2 MG/ML IJ SOLN
INTRAMUSCULAR | Status: AC
  Filled 2018-08-27: qty 1

## 2018-08-27 MED ORDER — DEXTROSE 5 % IV SOLN
INTRAVENOUS | Status: AC | PRN
  Administered 2018-08-27: 300 mg via INTRAVENOUS

## 2018-08-27 MED ORDER — PROPOFOL 1000 MG/100ML IV EMUL
INTRAVENOUS | Status: AC
  Administered 2018-08-27: 160 mg via INTRAVENOUS
  Filled 2018-08-27: qty 100

## 2018-08-27 MED ORDER — CALCIUM CHLORIDE 10 % IV SOLN
INTRAVENOUS | Status: AC | PRN
  Administered 2018-08-27: 1 g via INTRAVENOUS

## 2018-08-27 MED ORDER — ROCURONIUM BROMIDE 50 MG/5ML IV SOLN
80.0000 mg | Freq: Once | INTRAVENOUS | Status: AC
  Administered 2018-08-27: 15:00:00 80 mg via INTRAVENOUS
  Filled 2018-08-27: qty 8

## 2018-08-27 MED ORDER — BIVALIRUDIN TRIFLUOROACETATE 250 MG IV SOLR
INTRAVENOUS | Status: AC
  Filled 2018-08-27: qty 250

## 2018-08-27 MED ORDER — SODIUM CHLORIDE 0.9 % IV BOLUS
1000.0000 mL | Freq: Once | INTRAVENOUS | Status: AC
  Administered 2018-08-27: 15:00:00 1000 mL via INTRAVENOUS

## 2018-08-27 MED ORDER — SODIUM BICARBONATE 8.4 % IV SOLN
INTRAVENOUS | Status: AC | PRN
  Administered 2018-08-27 (×2): 50 meq via INTRAVENOUS

## 2018-08-27 MED ORDER — MIDAZOLAM HCL 2 MG/2ML IJ SOLN
INTRAMUSCULAR | Status: AC
  Filled 2018-08-27: qty 2

## 2018-08-27 MED ORDER — TICAGRELOR 90 MG PO TABS
ORAL_TABLET | ORAL | Status: AC
  Filled 2018-08-27: qty 2

## 2018-08-27 MED ORDER — FENTANYL CITRATE (PF) 100 MCG/2ML IJ SOLN
INTRAMUSCULAR | Status: AC
  Filled 2018-08-27: qty 2

## 2018-08-27 MED ORDER — EPINEPHRINE 1 MG/10ML IJ SOSY
PREFILLED_SYRINGE | INTRAMUSCULAR | Status: AC | PRN
  Administered 2018-08-27 (×6): 1 via INTRAVENOUS

## 2018-08-27 MED ORDER — PROPOFOL 10 MG/ML IV BOLUS
160.0000 mg | Freq: Once | INTRAVENOUS | Status: AC
  Administered 2018-08-27: 160 mg via INTRAVENOUS

## 2018-08-27 MED ORDER — LORAZEPAM 2 MG/ML IJ SOLN
2.0000 mg | Freq: Once | INTRAMUSCULAR | Status: AC
  Administered 2018-08-27: 2 mg via INTRAVENOUS

## 2018-08-28 MED FILL — Medication: Qty: 1 | Status: AC

## 2018-08-30 DIAGNOSIS — 419620001 Death: Secondary | SNOMED CT | POA: Insufficient documentation

## 2018-08-30 NOTE — Code Documentation (Signed)
Patient went into PEA arrest

## 2018-08-30 NOTE — Code Documentation (Signed)
Pulse check. No pulse. Charging and defib 200J at this time. Compressions resumed

## 2018-08-30 NOTE — Consult Note (Signed)
[facility] Cardiology  CARDIOLOGY CONSULT NOTE  Patient ID: Paul Bennett MRN: [id number] DOB/AGE: 1965/11/30 53 y.o.  Admit date: 09-Sep-2018 Referring Physician  Primary Physician  Primary Cardiologist  Reason for Consultation lateral ST elevation myocardial infarction  HPI: 53 year old gentleman referred for evaluation of lateral ST elevation myocardial infarction.  Patient was working out on an elliptical exercise machine, developed chest pain called EMS.  Prior to arrival, patient experienced seizure activity, given Versed and subsequently became unresponsive.  In the emergency room, patient remained unresponsive with dilated pupils.  ECG revealed sinus rhythm with ST elevation in leads I and aVL inferior reciprocal ST depression.  Follow-up ECG revealed atrial fibrillation with right bundle branch block.  Review of systems complete and found to be negative unless listed above     Past Medical History:  Diagnosis Date  . CHF (congestive heart failure) ([place])   . Coronary artery disease   . History of heart artery stent   . Hyperlipidemia   . Hypertension   . Presence of artificial shoulder joint   . Secondary polycythemia   . Sleep apnea     Past Surgical History:  Procedure Laterality Date  . APPENDECTOMY    . chest lump    . COLONOSCOPY WITH PROPOFOL N/A 03/20/2017   Procedure: COLONOSCOPY WITH PROPOFOL;  Surgeon: Manya Silvas, MD;  Location: [facility] ENDOSCOPY;  Service: Endoscopy;  Laterality: N/A;  . HERNIA REPAIR     Inguinal  . TOTAL SHOULDER REPLACEMENT Right     (Not in a hospital admission)  Social History   Socioeconomic History  . Marital status: Married    Spouse name: Not on file  . Number of children: Not on file  . Years of education: Not on file  . Highest education level: Not on file  Occupational History  . Not on file  Social Needs  . Financial resource strain: Not on file  . Food insecurity    Worry: Not on file    Inability: Not on file  .  Transportation needs    Medical: Not on file    Non-medical: Not on file  Tobacco Use  . Smoking status: Never Smoker  . Smokeless tobacco: Never Used  Substance and Sexual Activity  . Alcohol use: Yes    Alcohol/week: 6.0 standard drinks    Types: 6 Cans of beer per week    Comment: 2 times a week   . Drug use: No  . Sexual activity: Yes  Lifestyle  . Physical activity    Days per week: Not on file    Minutes per session: Not on file  . Stress: Not on file  Relationships  . Social Herbalist on phone: Not on file    Gets together: Not on file    Attends religious service: Not on file    Active member of club or organization: Not on file    Attends meetings of clubs or organizations: Not on file    Relationship status: Not on file  . Intimate partner violence    Fear of current or ex partner: Not on file    Emotionally abused: Not on file    Physically abused: Not on file    Forced sexual activity: Not on file  Other Topics Concern  . Not on file  Social History Narrative  . Not on file    Family History  Problem Relation Age of Onset  . Hypertension Mother  Review of systems complete and found to be negative unless listed above      PHYSICAL EXAM  General: Well developed, well nourished, in no acute distress HEENT:  Normocephalic and atramatic Neck:  No JVD.  Lungs: Clear bilaterally to auscultation and percussion. Heart: HRRR . Normal S1 and S2 without gallops or murmurs.  Abdomen: Bowel sounds are positive, abdomen soft and non-tender  Msk:  Back normal, normal gait. Normal strength and tone for age. Extremities: No clubbing, cyanosis or edema.   Neuro: Alert and oriented X 3. Psych:  Good affect, responds appropriately  Labs:   Lab Results  Component Value Date   WBC 5.9 02/10/2013   HGB 15.4 02/10/2013   HCT 45.2 02/10/2013   MCV 90 02/10/2013   PLT 133 (L) 02/10/2013   No results for input(s): NA, K, CL, CO2, BUN, CREATININE,  CALCIUM, PROT, BILITOT, ALKPHOS, ALT, AST, GLUCOSE in the last 168 hours.  Invalid input(s): LABALBU Lab Results  Component Value Date   CKTOTAL 245 (H) 02/10/2013   CKMB 2.6 02/10/2013   TROPONINI < 0.02 02/10/2013    Lab Results  Component Value Date   CHOL 102 12/09/2012   CHOL 115 01/09/2012   Lab Results  Component Value Date   HDL 47 12/09/2012   HDL 25 (L) 01/09/2012   Lab Results  Component Value Date   LDLCALC 34 12/09/2012   LDLCALC 73 01/09/2012   Lab Results  Component Value Date   TRIG 107 12/09/2012   TRIG 86 01/09/2012   No results found for: CHOLHDL No results found for: LDLDIRECT    Radiology: No results found.  EKG: Sinus rhythm, ST elevation in leads I and aVL  ASSESSMENT AND PLAN:   1.  Lateral ST elevation myocardial infarction, complicated by seizure, unresponsiveness  Recommendations  1.  Patient will require will intubation to stabilize airway 2.  Urgent head CT 3.  Probable emergent cardiac catheterization and primary PCI pending to patient in head CT  Signed: Isaias Cowman MD,PhD, [facility] 2018-08-31, 3:05 PM

## 2018-08-30 NOTE — Progress Notes (Signed)
Ch attended to the family that had an unexpected death. Ch provided emotional and grief support to the family and allowed time for them to be present with the pt that was now deceased. Ch divided time between immediate and extended family members that were in the family consult room. Family reflected on the good times they had with the deceased yet were still in a state of shock regarding how fast the pt died. The pt had 4 heart attacks yet the pt was committed to eating and be physically active to stay healthy. Pt daughter reports that the pt had just finished working out after leaving work when he started having heart palpitations and later had a seizure. Ch provided words of encouragement and a prayer of closure for the life of Mr. Busbee.  NO further needs at this time.    09-08-18 1654  Clinical Encounter Type  Visited With Patient and family together  Visit Type Follow-up;Psychological support;Spiritual support;Social support;Death;ED  Referral From Nurse  Consult/Referral To Chaplain  Spiritual Encounters  Spiritual Needs Prayer;Emotional;Grief support  Stress Factors  Patient Stress Factors Health changes;Major life changes  Family Stress Factors Exhausted;Family relationships;Health changes;Loss;Loss of control;Major life changes

## 2018-08-30 NOTE — ED Notes (Signed)
Family has left hospital at this time. Paperwork completed for funeral home information. Orderly called for transport to morgue.

## 2018-08-30 NOTE — Code Documentation (Signed)
Pulse check - No pulse - Compressions resumed

## 2018-08-30 NOTE — ED Notes (Signed)
Spoke with [employer] from [employer]. Verification number : [id number]

## 2018-08-30 NOTE — Code Documentation (Signed)
Wife at bedside.

## 2018-08-30 NOTE — ED Notes (Signed)
Brother and mom at bedside

## 2018-08-30 NOTE — ED Triage Notes (Addendum)
Patient arrived by EMS from home. Was working out on eleptical and started having substernal chest pain. 1 nitro given and 324 aspirin. Patient then had seizure X1 min. Was given 2 versed. Reports patient has been unresponsive since. Opens eyes to stimuli only. HX stent placement. Pupils dilated upon arrival

## 2018-08-30 NOTE — Progress Notes (Signed)
Patient successfully intubated and placed on vent at 400Vt, 18 Respirations, 100% FIO2, 5 PEEP. Patient coded shortly after placing on vent and was bagged throughout CPR until time of death was called.

## 2018-08-30 NOTE — Code Documentation (Signed)
Time of death called by MD Jari Pigg at 3:29pm

## 2018-08-30 NOTE — ED Provider Notes (Signed)
[facility] Emergency Department Provider Note  ____________________________________________  Time seen: Approximately 3:51 PM  I have reviewed the triage vital signs and the nursing notes.   HISTORY  Chief Complaint No chief complaint on file.    Level 5 Caveat: Portions of the History and Physical including HPI and review of systems are unable to be completely obtained due to patient being a poor historian   HPI Paul Bennett is a 53 y.o. male with a history of CAD, CHF, hypertension hyperlipidemia who is brought to the ED due to chest pain.  EMS report that the patient was working out at home on a treadmill or elliptical machine, started having sudden chest pain.  EMS gave nitroglycerin and aspirin, but during transport the patient appeared to have a seizure for which they gave Versed.  Since then the patient has been unresponsive.  Transmitted EKG shows a high lateral STEMI with inferior reciprocal changes.  No known recent trauma, no suspicions for drug abuse.      Past Medical History:  Diagnosis Date  . CHF (congestive heart failure) ([place])   . Coronary artery disease   . History of heart artery stent   . Hyperlipidemia   . Hypertension   . Presence of artificial shoulder joint   . Secondary polycythemia   . Sleep apnea      There are no active problems to display for this patient.    Past Surgical History:  Procedure Laterality Date  . APPENDECTOMY    . chest lump    . COLONOSCOPY WITH PROPOFOL N/A 03/20/2017   Procedure: COLONOSCOPY WITH PROPOFOL;  Surgeon: Manya Silvas, MD;  Location: [facility] ENDOSCOPY;  Service: Endoscopy;  Laterality: N/A;  . HERNIA REPAIR     Inguinal  . TOTAL SHOULDER REPLACEMENT Right      Prior to Admission medications   Medication Sig Start Date End Date Taking? Authorizing Provider  aspirin EC 81 MG tablet Take 81 mg by mouth daily.   Yes [provider]  losartan (COZAAR) 50 MG tablet  Take 50 mg by mouth daily.   Yes [provider]  rosuvastatin (CRESTOR) 40 MG tablet Take 40 mg by mouth daily.   Yes [provider]  ticagrelor (BRILINTA) 60 MG TABS tablet Take by mouth daily.   Yes [provider]     Allergies Patient has no allergy information on record.   Family History  Problem Relation Age of Onset  . Hypertension Mother     Social History Social History   Tobacco Use  . Smoking status: Never Smoker  . Smokeless tobacco: Never Used  Substance Use Topics  . Alcohol use: Yes    Alcohol/week: 6.0 standard drinks    Types: 6 Cans of beer per week    Comment: 2 times a week   . Drug use: No    Review of Systems Level 5 Caveat: Portions of the History and Physical including HPI and review of systems are unable to be completely obtained due to patient being a poor historian    Cardiovascular: Positive as above chest pain  ____________________________________________   PHYSICAL EXAM:  VITAL SIGNS: ED Triage Vitals [09-04-18 1500]  Enc Vitals Group     BP (!) 136/107     Pulse Rate 78     Resp (!) 41     Temp      Temp src      SpO2 (!) 86 %     Weight  Height      Head Circumference      Peak Flow      Pain Score      Pain Loc      Pain Edu?      Excl. in [place]?     Vital signs reviewed, nursing assessments reviewed.   Constitutional:   Unresponsive. Eyes:   Conjunctivae are normal.PERRL. ENT      Head:   Normocephalic and atraumatic.      Nose:   No congestion/rhinnorhea.  No epistaxis      Mouth/Throat:   Dry mucous membranes, no pharyngeal erythema. No peritonsillar mass.      Hematological/Lymphatic/Immunilogical:   No cervical lymphadenopathy. Cardiovascular:   RRR. Symmetric bilateral radial and DP pulses.  No murmurs. Cap refill less than 2 seconds. Respiratory:   Tachypnea, gurgling respirations Gastrointestinal:   Soft and nontender. Non distended. There is no CVA tenderness.  No rebound,  rigidity, or guarding.  Musculoskeletal:   Normal range of motion in all extremities. No joint effusions.  No lower extremity tenderness.  No edema. Neurologic:   Unresponsive.  No spontaneous motor activity.  Skin:    Skin is warm, dry and intact. No rash noted.  No petechiae, purpura, or bullae.  ____________________________________________    LABS (pertinent positives/negatives) (all labs ordered are listed, but only abnormal results are displayed) Labs Reviewed  GLUCOSE, CAPILLARY - Abnormal; Notable for the following components:      Result Value   Glucose-Capillary 157 (*)    All other components within normal limits  SARS CORONAVIRUS 2 (HOSPITAL ORDER, [place] [facility])   ____________________________________________   EKG  Interpreted by me Sinus rhythm, rate of about 80.  Normal QRS.  ST elevation in 1 and aVL with ST depression and T wave inversion in the inferior leads consistent with high lateral STEMI  ____________________________________________    RADIOLOGY  No results found.  ____________________________________________   PROCEDURES .Critical Care Performed by: Carrie Mew, MD Authorized by: Carrie Mew, MD   Critical care provider statement:    Critical care time (minutes):  35   Critical care time was exclusive of:  Separately billable procedures and treating other patients   Critical care was necessary to treat or prevent imminent or life-threatening deterioration of the following conditions:  Cardiac failure, circulatory failure and CNS failure or compromise   Critical care was time spent personally by me on the following activities:  Development of treatment plan with patient or surrogate, discussions with consultants, evaluation of patient's response to treatment, examination of patient, obtaining history from patient or surrogate, ordering and performing treatments and interventions, ordering and review of  laboratory studies, ordering and review of radiographic studies, pulse oximetry, re-evaluation of patient's condition and review of old charts Comments:     ACLS, CPR due to asystolic arrest   Procedure Name: Intubation Date/Time: 09-03-18 3:54 PM Performed by: Carrie Mew, MD Pre-anesthesia Checklist: Patient identified, Patient being monitored, Emergency Drugs available, Timeout performed and Suction available Oxygen Delivery Method: Non-rebreather mask Preoxygenation: Pre-oxygenation with 100% oxygen Induction Type: Rapid sequence Ventilation: Mask ventilation without difficulty Laryngoscope Size: Glidescope and 3 Grade View: Grade I Tube type: Subglottic suction tube Tube size: 8.0 mm Number of attempts: 1 Placement Confirmation: ETT inserted through vocal cords under direct vision,  CO2 detector and Breath sounds checked- equal and bilateral Secured at: 26 cm Tube secured with: ETT holder Dental Injury: Teeth and Oropharynx as per pre-operative assessment  Comments:  ____________________________________________  DIFFERENTIAL DIAGNOSIS   STEMI, intracranial hemorrhage  CLINICAL IMPRESSION / ASSESSMENT AND PLAN / ED COURSE  Pertinent labs & imaging results that were available during my care of the patient were reviewed by me and considered in my medical decision making (see chart for details).   Paul Bennett was evaluated in Emergency Department on Sep 19, 2018 for the symptoms described in the history of present illness. He was evaluated in the context of the global COVID-19 pandemic, which necessitated consideration that the patient might be at risk for infection with the SARS-CoV-2 virus that causes COVID-19. Institutional protocols and algorithms that pertain to the evaluation of patients at risk for COVID-19 are in a state of rapid change based on information released by regulatory bodies including the CDC and federal and state organizations. These  policies and algorithms were followed during the patient's care in the ED.   Patient presents with suspected STEMI, however had prehospital seizure and loss of consciousness.  Initially had to be intubated for airway protection due to suspected intracranial hemorrhage and gurgling respirations.  Intubation was completed uneventfully, but shortly thereafter patient had bradycardia and asystolic cardiac arrest.  See code flow sheet for further documentation.  As for multiple rounds and ACLS medications, there is still no return of spontaneous circulation.  Bedside ultrasound demonstrated cardiac standstill. Wife was at bedside at time of death and cessation of resus efforts.      ____________________________________________   FINAL CLINICAL IMPRESSION(S) / ED DIAGNOSES    Final diagnoses:  ST elevation myocardial infarction (STEMI), unspecified artery ([place])  Seizure [facility])  Death     ED Discharge Orders    None      Portions of this note were generated with dragon dictation software. Dictation errors may occur despite best attempts at proofreading.   Carrie Mew, MD 19-Sep-2018 [phone]

## 2018-08-30 NOTE — Code Documentation (Signed)
Pulse check. MD funke using [place] at this time to look for cardiac activity. No activity. No pulse.

## 2018-08-30 NOTE — Code Documentation (Signed)
Pulse check. No pulse compressions resumed

## 2018-08-30 NOTE — ED Notes (Signed)
Pt transported to morgue by orderly at this time.

## 2018-08-30 NOTE — ED Notes (Signed)
Family at bedside. 

## 2018-08-30 NOTE — ED Notes (Signed)
Daughter and chaplain at bedside with wife

## 2018-08-30 NOTE — Code Documentation (Addendum)
Pulse check. No pulse. 200J at this time. Compressions resumed

## 2018-08-30 NOTE — Code Documentation (Signed)
Pulse check. No pulse. Resuming compressions

## 2018-08-30 NOTE — ED Notes (Signed)
300mg  amoidarone bolus was given at this time from code cart

## 2018-08-30 NOTE — Code Documentation (Signed)
Pulse check. Defib 200J at this time. Compressions resumed

## 2018-08-30 DEATH — deceased

## 2019-12-30 DEATH — deceased
